# Patient Record
Sex: Female | Born: 1990
Health system: Southern US, Community
[De-identification: ages and names within clinical notes are randomized; demographics above are authoritative.]

## PROBLEM LIST (undated history)

## (undated) DIAGNOSIS — D649 Anemia, unspecified: Secondary | ICD-10-CM

## (undated) DIAGNOSIS — D352 Benign neoplasm of pituitary gland: Secondary | ICD-10-CM

## (undated) DIAGNOSIS — J45909 Unspecified asthma, uncomplicated: Secondary | ICD-10-CM

## (undated) HISTORY — PX: UMBILICAL HERNIA REPAIR: SHX196

---

## 2015-08-10 DIAGNOSIS — D352 Benign neoplasm of pituitary gland: Secondary | ICD-10-CM | POA: Insufficient documentation

## 2015-08-10 DIAGNOSIS — L209 Atopic dermatitis, unspecified: Secondary | ICD-10-CM | POA: Insufficient documentation

## 2016-11-09 DIAGNOSIS — R300 Dysuria: Secondary | ICD-10-CM | POA: Insufficient documentation

## 2016-11-09 DIAGNOSIS — D649 Anemia, unspecified: Secondary | ICD-10-CM | POA: Insufficient documentation

## 2017-04-23 DIAGNOSIS — J45990 Exercise induced bronchospasm: Secondary | ICD-10-CM | POA: Insufficient documentation

## 2020-02-16 ENCOUNTER — Ambulatory Visit (HOSPITAL_COMMUNITY): Admit: 2020-02-16 | Discharge status: Against Medical Advice | Payer: Self-pay

## 2020-05-02 ENCOUNTER — Other Ambulatory Visit: Payer: Self-pay

## 2020-05-02 ENCOUNTER — Ambulatory Visit: Payer: 59 | Attending: Family Medicine | Admitting: Family Medicine

## 2020-05-02 DIAGNOSIS — F419 Anxiety disorder, unspecified: Secondary | ICD-10-CM

## 2020-05-02 DIAGNOSIS — E221 Hyperprolactinemia: Secondary | ICD-10-CM

## 2020-05-02 NOTE — Progress Notes (Signed)
   Virtual Visit via Telephone Note  I connected with Monique Middleton, on 05/02/2020 at 4:02 PM by telephone due to the COVID-19 pandemic and verified that I am speaking with the correct person using two identifiers.   Consent: I discussed the limitations, risks, security and privacy concerns of performing an evaluation and management service by telephone and the availability of in person appointments. I also discussed with the patient that there may be a patient responsible charge related to this service. The patient expressed understanding and agreed to proceed.   Location of Patient: Home  Location of Provider: Clinic   Persons participating in Telemedicine visit: [place] Dr. Margarita Rana     History of Present Illness: 30 year old female h/o Prolactinoma on Carbegoline,  hernia surgery in 2018 Relocated from [place] 10/2019. Prolactinoma has been managed medically. Followed by Endocrine in the past. Last MRI was in 2020 and per patient size was stable. Last time she was on medication was in 02/2020.  This was diagnosed when she had amenorrhea for a period of 6 months and labs revealed hyperprolactinemia and MRI in keeping with prolactinoma. Denies presence of blurry vision (wears glasses), does not have headaches but did have one with her menstrual cycle recently.  She also mentioned noticing palpitations and some form of anxiety once in a while prior to going to bed.  She does not have anxiety on a daily basis and is not open to taking medications for this. No past medical history on file. Not on File  No current outpatient medications on file prior to visit.   No current facility-administered medications on file prior to visit.    ROS: See HPI  Observations/Objective: Awake, alert, and 2x3 Not in acute distress  Assessment and Plan: 1. Hyperprolactinemia ([place]) Advised we would need her medical records She will sign a medical release once she comes into the  office for labs tomorrow - Prolactin; Future - FSH/LH; Future - CMP14+EGFR; Future - CBC with Differential/Platelet; Future - Ambulatory referral to Endocrinology  2.  Anxiety Occurring prior to bedtime We have discussed control measures including breathing into a bag, deep breathing, counting Consider psychotherapy if symptoms persist Symptoms are not bothersome and medication is not indicated at this time agreed upon via shared decision making.  Follow Up Instructions: 3 months for chronic disease management   I discussed the assessment and treatment plan with the patient. The patient was provided an opportunity to ask questions and all were answered. The patient agreed with the plan and demonstrated an understanding of the instructions.   The patient was advised to call back or seek an in-person evaluation if the symptoms worsen or if the condition fails to improve as anticipated.     I provided 12 minutes total of non-face-to-face time during this encounter.   Charlott Rakes, MD, FAAFP. [facility] and [facility] [place], [place]   05/02/2020, 4:02 PM

## 2020-05-03 NOTE — Addendum Note (Signed)
Addended bySteffanie Dunn on: 05/03/2020 08:54 AM   Modules accepted: Orders

## 2020-05-04 ENCOUNTER — Other Ambulatory Visit: Payer: Self-pay | Admitting: Family Medicine

## 2020-05-04 DIAGNOSIS — D649 Anemia, unspecified: Secondary | ICD-10-CM

## 2020-05-04 LAB — CBC WITH DIFFERENTIAL/PLATELET
Basophils Absolute: 0 10*3/uL (ref 0.0–0.2)
Basos: 1 %
EOS (ABSOLUTE): 0.5 10*3/uL — ABNORMAL HIGH (ref 0.0–0.4)
Eos: 8 %
Hematocrit: 30.1 % — ABNORMAL LOW (ref 34.0–46.6)
Hemoglobin: 8.8 g/dL — ABNORMAL LOW (ref 11.1–15.9)
Immature Grans (Abs): 0 10*3/uL (ref 0.0–0.1)
Immature Granulocytes: 0 %
Lymphocytes Absolute: 1.7 10*3/uL (ref 0.7–3.1)
Lymphs: 27 %
MCH: 21.8 pg — ABNORMAL LOW (ref 26.6–33.0)
MCHC: 29.2 g/dL — ABNORMAL LOW (ref 31.5–35.7)
MCV: 75 fL — ABNORMAL LOW (ref 79–97)
Monocytes Absolute: 0.5 10*3/uL (ref 0.1–0.9)
Monocytes: 9 %
Neutrophils Absolute: 3.4 10*3/uL (ref 1.4–7.0)
Neutrophils: 55 %
Platelets: 284 10*3/uL (ref 150–450)
RBC: 4.03 x10E6/uL (ref 3.77–5.28)
RDW: 15.8 % — ABNORMAL HIGH (ref 11.7–15.4)
WBC: 6.2 10*3/uL (ref 3.4–10.8)

## 2020-05-04 LAB — CMP14+EGFR
ALT: 10 IU/L (ref 0–32)
AST: 16 IU/L (ref 0–40)
Albumin/Globulin Ratio: 1.5 (ref 1.2–2.2)
Albumin: 4.2 g/dL (ref 3.9–5.0)
Alkaline Phosphatase: 66 IU/L (ref 44–121)
BUN/Creatinine Ratio: 12 (ref 9–23)
BUN: 10 mg/dL (ref 6–20)
Bilirubin Total: <0.2 mg/dL (ref 0.0–1.2)
CO2: 23 mmol/L (ref 20–29)
Calcium: 9.3 mg/dL (ref 8.7–10.2)
Chloride: 100 mmol/L (ref 96–106)
Creatinine, Ser: 0.85 mg/dL (ref 0.57–1.00)
Globulin, Total: 2.8 g/dL (ref 1.5–4.5)
Glucose: 82 mg/dL (ref 65–99)
Potassium: 4.4 mmol/L (ref 3.5–5.2)
Sodium: 136 mmol/L (ref 134–144)
Total Protein: 7 g/dL (ref 6.0–8.5)
eGFR: 96 mL/min/{1.73_m2} (ref >59)

## 2020-05-04 LAB — FSH/LH
FSH: 6 m[IU]/mL
LH: 8.4 m[IU]/mL

## 2020-05-04 LAB — PROLACTIN: Prolactin: 96.6 ng/mL — ABNORMAL HIGH (ref 4.8–23.3)

## 2020-05-04 MED ORDER — FERROUS SULFATE 324 (65 FE) MG PO TBEC: 324.0000 mg | DELAYED_RELEASE_TABLET | Freq: Two times a day (BID) | ORAL | 3 refills | Status: DC

## 2020-05-05 ENCOUNTER — Telehealth: Payer: Self-pay

## 2020-05-05 NOTE — Telephone Encounter (Signed)
-----   Message from Charlott Rakes, MD sent at 05/04/2020  5:13 PM EDT ----- Please inform her that her prolactin level is elevated and she had been referred to endocrinology for follow-up.  She is also anemic.  Can you please have the lab add on iron studies to her labs?  I ordered iron tablets but we have no pharmacy on file, can you please obtain the pharmacy and send this prescription?  Thank you.

## 2020-05-05 NOTE — Telephone Encounter (Signed)
Phone number is not correct. Pt will be mailed a letter to call our office.

## 2020-05-06 LAB — IRON,TIBC AND FERRITIN PANEL
Ferritin: 7 ng/mL — ABNORMAL LOW (ref 15–150)
Iron Saturation: 4 % — CL (ref 15–55)
Iron: 17 ug/dL — ABNORMAL LOW (ref 27–159)
Total Iron Binding Capacity: 451 ug/dL — ABNORMAL HIGH (ref 250–450)
UIBC: 434 ug/dL — ABNORMAL HIGH (ref 131–425)

## 2020-05-06 LAB — SPECIMEN STATUS REPORT

## 2020-05-10 ENCOUNTER — Other Ambulatory Visit: Payer: Self-pay

## 2020-05-10 MED ORDER — FERROUS SULFATE 324 (65 FE) MG PO TBEC: 324.0000 mg | DELAYED_RELEASE_TABLET | Freq: Two times a day (BID) | ORAL | 3 refills | Status: AC

## 2020-05-10 NOTE — Telephone Encounter (Signed)
Copied from [place] [id number]. Topic: Quick Communication - Lab Results (Clinic Use ONLY) >> May 09, 2020  3:18 PM Lennox Solders wrote: Pt is calling back and would like blood work results. Pt phone number has been updated

## 2020-05-11 NOTE — Telephone Encounter (Signed)
Pt has been informed of lab results and medication has been sent to pharmacy.

## 2020-07-06 ENCOUNTER — Encounter: Payer: Self-pay | Admitting: Endocrinology

## 2020-07-06 ENCOUNTER — Other Ambulatory Visit: Payer: Self-pay

## 2020-07-06 ENCOUNTER — Ambulatory Visit (INDEPENDENT_AMBULATORY_CARE_PROVIDER_SITE_OTHER): Payer: 59 | Admitting: Endocrinology

## 2020-07-06 DIAGNOSIS — E221 Hyperprolactinemia: Secondary | ICD-10-CM | POA: Diagnosis not present

## 2020-07-06 MED ORDER — CABERGOLINE 0.5 MG PO TABS: 0.2500 mg | ORAL_TABLET | ORAL | 3 refills | Status: DC

## 2020-07-06 NOTE — Patient Instructions (Addendum)
I have sent a prescription to your pharmacy, to resume the cabergoline.   This will increase your chances of getting pregnant.  If this happens, you should stop taking it.   Please recheck the blood test in 1 month.   Please come back for a follow-up appointment in 6 months.

## 2020-07-06 NOTE — Progress Notes (Signed)
Subjective:    Patient ID: Monique Middleton, female    DOB: 05-Aug-1990, 30 y.o.   MRN: [id number]  HPI Pt is referred by Dr Margarita Rana, for hyperprolactinemia.  Pt had menarche at age 68.  She has had regular menses, except when on OC's.  until she started OC's x a few mos in 2012.  She is G0.  She reports elev prolactin was found in 2014.  She does not want a pregnancy now, but would like to preserve fertility for the future.    She denies the following: excessive exercise, opiates, antipsychotics, brain XRT, brain surgery, cirrhosis,  thyroid dz, seizures, renal dz, zoster, brain injury, or chest wall injury.  She has never had pituitary imaging.  She took cabergoline 2014-2021.  She says 2014 MRI showed mild abnormality.    Social History   Socioeconomic History  . Marital status: Single    Spouse name: Not on file  . Number of children: Not on file  . Years of education: Not on file  . Highest education level: Not on file  Occupational History  . Not on file  Tobacco Use  . Smoking status: Not on file  . Smokeless tobacco: Not on file  Substance and Sexual Activity  . Alcohol use: Not on file  . Drug use: Not on file  . Sexual activity: Not on file  Other Topics Concern  . Not on file  Social History Narrative  . Not on file   Social Determinants of Health   Financial Resource Strain: Not on file  Food Insecurity: Not on file  Transportation Needs: Not on file  Physical Activity: Not on file  Stress: Not on file  Social Connections: Not on file  Intimate Partner Violence: Not on file    Current Outpatient Medications on File Prior to Visit  Medication Sig Dispense Refill  . ferrous sulfate 324 (65 Fe) MG TBEC Take 1 tablet (324 mg total) by mouth in the morning and at bedtime. 60 tablet 3   No current facility-administered medications on file prior to visit.    No Known Allergies  Family History  Problem Relation Age of Onset  . Other Neg Hx        pituitary  disorder    BP 110/80 (BP Location: Right Arm, Patient Position: Sitting, Cuff Size: Large)   Pulse 88   Ht 5\' 5"  (1.651 m)   Wt 228 lb 3.2 oz (103.5 kg)   SpO2 99%   BMI 37.97 kg/m     Review of Systems denies headache, visual loss, galactorrhea, and n/v.  She has frequent urination and weight gain.     Objective:   Physical Exam VITAL SIGNS:  See vs page.  GENERAL: no distress.  NECK: There is no palpable thyroid enlargement.  No thyroid nodule is palpable.  No palpable lymphadenopathy at the anterior neck. Ext: no leg edema  Ptolactin=97 TSH=2.4  MRI (2020): mild pituitary swelling   I have reviewed outside records, and summarized: Pt was noted to have elevated prolactin, and referred here.  Anxiety and anemia were also addressed     Assessment & Plan:  Hyperprolactinemia, new to me: uncontrolled.   Patient Instructions  I have sent a prescription to your pharmacy, to resume the cabergoline.   This will increase your chances of getting pregnant.  If this happens, you should stop taking it.   Please recheck the blood test in 1 month.   Please come back for a follow-up appointment  in 6 months.

## 2020-07-07 ENCOUNTER — Encounter: Payer: Self-pay | Admitting: Endocrinology

## 2020-07-08 NOTE — Telephone Encounter (Signed)
Please advise 

## 2020-08-25 ENCOUNTER — Other Ambulatory Visit: Payer: Self-pay

## 2020-08-25 ENCOUNTER — Ambulatory Visit
Admission: RE | Admit: 2020-08-25 | Discharge: 2020-08-25 | Disposition: A | Discharge status: Home / Self Care | Payer: 59 | Source: Ambulatory Visit | Attending: Nurse Practitioner | Admitting: Nurse Practitioner

## 2020-08-25 VITALS — BP 139/89 | HR 76 | Temp 98.1°F | Resp 14

## 2020-08-25 DIAGNOSIS — L02219 Cutaneous abscess of trunk, unspecified: Secondary | ICD-10-CM | POA: Insufficient documentation

## 2020-08-25 DIAGNOSIS — N912 Amenorrhea, unspecified: Secondary | ICD-10-CM | POA: Insufficient documentation

## 2020-08-25 DIAGNOSIS — K439 Ventral hernia without obstruction or gangrene: Secondary | ICD-10-CM | POA: Insufficient documentation

## 2020-08-25 HISTORY — DX: Benign neoplasm of pituitary gland: D35.2

## 2020-08-25 MED ORDER — SULFAMETHOXAZOLE-TRIMETHOPRIM 800-160 MG PO TABS: 1.0000 | ORAL_TABLET | Freq: Two times a day (BID) | ORAL | 0 refills | Status: AC

## 2020-08-25 NOTE — ED Triage Notes (Signed)
Suprapubic abcesses, recurrent. Patient states she shaved with a razor and believes it caused in-grown hairs. Denies fever.

## 2020-08-25 NOTE — ED Notes (Signed)
Witnessed exam and I&D procedure. Wound dressed

## 2020-08-25 NOTE — Discharge Instructions (Addendum)
Take medications as prescribed  Moist, warm compress to site at least three times a day  Do not mash or manipulate the wound  Ok to change top dressing  Do not remove packing  Return in 2 days to have packing removed and wound re-checked

## 2020-08-25 NOTE — ED Provider Notes (Signed)
[facility]    CSN: [id number] Arrival date & time: 08/25/20  1049      History   Chief Complaint Chief Complaint  Patient presents with   Abscess    HPI Monique Middleton is a 30 y.o. female.   Subjective:   Monique Middleton is a 30 y.o. female who presents for evaluation of a probable cutaneous abscess. Lesion is located in the right sided pubic region. Onset was 3 weeks ago. Symptoms started several days after shaving pubic area. Symptoms have waxed and waned but are worse overall. She has had smaller abscesses to the same area as well that has spontaneously drained. The larger one that she is seeking evaluation for today has not drained and is getting larger in size.   Abscess has associated symptoms of spontaneous drainage and pain. Patient does not have previous history of cutaneous abscesses. Patient does not have diabetes.  The following portions of the patient's history were reviewed and updated as appropriate: allergies, current medications, past family history, past medical history, past social history, past surgical history, and problem list.     Past Medical History:  Diagnosis Date   Pituitary adenoma [facility])     Patient Active Problem List   Diagnosis Date Noted   Amenorrhea 08/25/2020   Hernia of anterior abdominal wall 08/25/2020   Hyperprolactinemia ([place]) 07/06/2020   Exercise-induced asthma 04/23/2017   Anemia 11/09/2016   Difficult or painful urination 11/09/2016   Atopic dermatitis 08/10/2015   Prolactinoma ([place]) 08/10/2015    History reviewed. No pertinent surgical history.  OB History   No obstetric history on file.      Home Medications    Prior to Admission medications   Medication Sig Start Date End Date Taking? Authorizing Provider  cabergoline (DOSTINEX) 0.5 MG tablet Take 0.5 tablets (0.25 mg total) by mouth 2 (two) times a week. 07/07/20  Yes Renato Shin, MD  cabergoline (DOSTINEX) 0.5 MG tablet TAKE 2 TABLETS BY  MOUTH TWICE WEEKLY FOR A TOTAL OF 4 TABS PER WEEK 10/08/19  Yes [provider]  ferrous sulfate 324 (65 Fe) MG TBEC Take 1 tablet (324 mg total) by mouth in the morning and at bedtime. 05/10/20  Yes Charlott Rakes, MD  sulfamethoxazole-trimethoprim (BACTRIM DS) 800-160 MG tablet Take 1 tablet by mouth 2 (two) times daily for 7 days. 08/25/20 09/01/20 Yes Enrique Sack, FNP    Family History Family History  Problem Relation Age of Onset   Other Neg Hx        pituitary disorder    Social History Social History   Tobacco Use   Smoking status: Unknown     Allergies   Amoxicillin   Review of Systems Review of Systems  Constitutional:  Negative for fever.  Skin:  Positive for wound.  All other systems reviewed and are negative.   Physical Exam Triage Vital Signs ED Triage Vitals  Enc Vitals Group     BP 08/25/20 1211 139/89     Pulse Rate 08/25/20 1211 76     Resp 08/25/20 1211 14     Temp 08/25/20 1211 98.1 F (36.7 C)     Temp Source 08/25/20 1211 Oral     SpO2 08/25/20 1211 99 %     Weight --      Height --      Head Circumference --      Peak Flow --      Pain Score 08/25/20 1212 7     Pain  Loc --      Pain Edu? --      Excl. in [place]? --    No data found.  Updated Vital Signs BP 139/89 (BP Location: Left Arm)   Pulse 76   Temp 98.1 F (36.7 C) (Oral)   Resp 14   LMP 08/24/2020   SpO2 99%   Visual Acuity Right Eye Distance:   Left Eye Distance:   Bilateral Distance:    Right Eye Near:   Left Eye Near:    Bilateral Near:     Physical Exam Vitals reviewed. Exam conducted with a chaperone present.  Constitutional:      Appearance: Normal appearance.  HENT:     Head: Normocephalic.  Cardiovascular:     Rate and Rhythm: Normal rate.  Pulmonary:     Effort: Pulmonary effort is normal.  Genitourinary:    Comments: Pubic abscess noted Musculoskeletal:        General: Normal range of motion.     Cervical back: Normal range of motion  and neck supple.  Skin:    General: Skin is warm and dry.  Neurological:     General: No focal deficit present.     Mental Status: She is alert and oriented to person, place, and time.  Psychiatric:        Mood and Affect: Mood normal.        Behavior: Behavior normal.     UC Treatments / Results  Labs (all labs ordered are listed, but only abnormal results are displayed) Labs Reviewed  AEROBIC CULTURE W GRAM STAIN (SUPERFICIAL SPECIMEN)    EKG   Radiology No results found.  Procedures Incision and Drainage  Date/Time: 08/25/2020 1:31 PM Performed by: Enrique Sack, FNP Authorized by: Enrique Sack, FNP   Consent:    Consent obtained:  Verbal   Consent given by:  Patient   Risks, benefits, and alternatives were discussed: yes     Risks discussed:  Bleeding, incomplete drainage, pain and infection Universal protocol:    Patient identity confirmed:  Verbally with patient Location:    Type:  Abscess   Location:  Anogenital   Anogenital location: pubic. Pre-procedure details:    Skin preparation:  Povidone-iodine Sedation:    Sedation type:  None Anesthesia:    Anesthesia method:  Local infiltration   Local anesthetic:  Lidocaine 1% w/o epi Procedure type:    Complexity:  Simple Procedure details:    Incision types:  Single straight   Wound management:  Probed and deloculated   Drainage:  Purulent   Drainage amount:  Scant   Wound treatment:  Drain placed   Packing materials:  1/4 in iodoform gauze Post-procedure details:    Procedure completion:  Tolerated well, no immediate complications (including critical care time)  Medications Ordered in UC Medications - No data to display  Initial Impression / Assessment and Plan / UC Course  I have reviewed the triage vital signs and the nursing notes.  Pertinent labs & imaging results that were available during my care of the patient were reviewed by me and considered in my medical decision making (see  chart for details).     30 yo female with abscess to the pubic region.  Patient is afebrile.  Nontoxic.   Plan:  Apply hot compresses frequently to promote drainage. Oral antibiotics -- see med orders. I & D procedure as above. RTC in 2 days or PRN.  Today's evaluation has revealed no signs of a dangerous process.  Discussed diagnosis with patient and/or guardian. Patient and/or guardian aware of their diagnosis, possible red flag symptoms to watch out for and need for close follow up. Patient and/or guardian understands verbal and written discharge instructions. Patient and/or guardian comfortable with plan and disposition.  Patient and/or guardian has a clear mental status at this time, good insight into illness (after discussion and teaching) and has clear judgment to make decisions regarding their care  This care was provided during an unprecedented National Emergency due to the Novel Coronavirus (COVID-19) pandemic. COVID-19 infections and transmission risks place heavy strains on healthcare resources.  As this pandemic evolves, our facility, providers, and staff strive to respond fluidly, to remain operational, and to provide care relative to available resources and information. Outcomes are unpredictable and treatments are without well-defined guidelines. Further, the impact of COVID-19 on all aspects of urgent care, including the impact to patients seeking care for reasons other than COVID-19, is unavoidable during this national emergency. At this time of the global pandemic, management of patients has significantly changed, even for non-COVID positive patients given high local and regional COVID volumes at this time requiring high healthcare system and resource utilization. The standard of care for management of both COVID suspected and non-COVID suspected patients continues to change rapidly at the local, regional, national, and global levels. This patient was worked up and treated to the  best available but ever changing evidence and resources available at this current time.   Documentation was completed with the aid of voice recognition software. Transcription may contain typographical errors.   Final Clinical Impressions(s) / UC Diagnoses   Final diagnoses:  Abscess of pubic region     Discharge Instructions      Take medications as prescribed  Moist, warm compress to site at least three times a day  Do not mash or manipulate the wound  Ok to change top dressing  Do not remove packing  Return in 2 days to have packing removed and wound re-checked      ED Prescriptions     Medication Sig Dispense Auth. Provider   sulfamethoxazole-trimethoprim (BACTRIM DS) 800-160 MG tablet Take 1 tablet by mouth 2 (two) times daily for 7 days. 14 tablet Enrique Sack, FNP      PDMP not reviewed this encounter.   Enrique Sack,  08/25/20 1445

## 2020-08-27 ENCOUNTER — Encounter: Payer: Self-pay | Admitting: Emergency Medicine

## 2020-08-27 ENCOUNTER — Other Ambulatory Visit: Payer: Self-pay

## 2020-08-27 ENCOUNTER — Ambulatory Visit
Admission: EM | Admit: 2020-08-27 | Discharge: 2020-08-27 | Disposition: A | Discharge status: Home / Self Care | Payer: 59

## 2020-08-27 DIAGNOSIS — Z5189 Encounter for other specified aftercare: Secondary | ICD-10-CM

## 2020-08-27 DIAGNOSIS — L0291 Cutaneous abscess, unspecified: Secondary | ICD-10-CM

## 2020-08-27 NOTE — ED Triage Notes (Signed)
Patient c/o abscess x 2 weeks.   Patient seen 2 days ago at this clinic and had the site drained per patient statement.   Patient states "the packing feel out last night".   Patient endorses foul odor to site.   Patient endorses serosanguinous drainage.   Patient was placed on Bactrim and started regimen yesterday.

## 2020-08-27 NOTE — Discharge Instructions (Addendum)
Continue changing dressings at least twice daily.  Complete entire course of antibiotics.  Return as needed.

## 2020-08-28 LAB — AEROBIC CULTURE W GRAM STAIN (SUPERFICIAL SPECIMEN): Special Requests: NORMAL

## 2020-10-27 ENCOUNTER — Other Ambulatory Visit: Payer: Self-pay

## 2020-10-27 ENCOUNTER — Other Ambulatory Visit (INDEPENDENT_AMBULATORY_CARE_PROVIDER_SITE_OTHER): Payer: 59

## 2020-10-27 DIAGNOSIS — E221 Hyperprolactinemia: Secondary | ICD-10-CM | POA: Diagnosis not present

## 2020-10-27 LAB — TSH: TSH: 2.5 u[IU]/mL (ref 0.35–5.50)

## 2020-10-27 LAB — T4, FREE: Free T4: 0.8 ng/dL (ref 0.60–1.60)

## 2020-10-27 LAB — PROLACTIN: Prolactin: 62.5 ng/mL — ABNORMAL HIGH

## 2020-10-28 ENCOUNTER — Other Ambulatory Visit: Payer: Self-pay | Admitting: Endocrinology

## 2020-10-28 MED ORDER — CABERGOLINE 0.5 MG PO TABS: 0.5000 mg | ORAL_TABLET | ORAL | 3 refills | Status: AC

## 2020-11-15 ENCOUNTER — Encounter (HOSPITAL_BASED_OUTPATIENT_CLINIC_OR_DEPARTMENT_OTHER): Payer: Self-pay | Admitting: Emergency Medicine

## 2020-11-15 ENCOUNTER — Ambulatory Visit
Admission: EM | Admit: 2020-11-15 | Discharge: 2020-11-15 | Disposition: A | Discharge status: Home / Self Care | Payer: 59 | Attending: Internal Medicine | Admitting: Internal Medicine

## 2020-11-15 ENCOUNTER — Emergency Department (HOSPITAL_BASED_OUTPATIENT_CLINIC_OR_DEPARTMENT_OTHER)
Admission: EM | Admit: 2020-11-15 | Discharge: 2020-11-15 | Disposition: A | Discharge status: Home / Self Care | Payer: 59 | Attending: Emergency Medicine | Admitting: Emergency Medicine

## 2020-11-15 ENCOUNTER — Encounter: Payer: Self-pay | Admitting: Emergency Medicine

## 2020-11-15 ENCOUNTER — Emergency Department (HOSPITAL_BASED_OUTPATIENT_CLINIC_OR_DEPARTMENT_OTHER): Payer: 59

## 2020-11-15 ENCOUNTER — Other Ambulatory Visit: Payer: Self-pay

## 2020-11-15 DIAGNOSIS — N739 Female pelvic inflammatory disease, unspecified: Secondary | ICD-10-CM | POA: Diagnosis not present

## 2020-11-15 DIAGNOSIS — N730 Acute parametritis and pelvic cellulitis: Secondary | ICD-10-CM

## 2020-11-15 DIAGNOSIS — R52 Pain, unspecified: Secondary | ICD-10-CM

## 2020-11-15 DIAGNOSIS — R110 Nausea: Secondary | ICD-10-CM | POA: Diagnosis not present

## 2020-11-15 DIAGNOSIS — K5900 Constipation, unspecified: Secondary | ICD-10-CM

## 2020-11-15 DIAGNOSIS — R1031 Right lower quadrant pain: Secondary | ICD-10-CM

## 2020-11-15 DIAGNOSIS — R109 Unspecified abdominal pain: Secondary | ICD-10-CM | POA: Diagnosis not present

## 2020-11-15 DIAGNOSIS — D259 Leiomyoma of uterus, unspecified: Secondary | ICD-10-CM

## 2020-11-15 LAB — COMPREHENSIVE METABOLIC PANEL
ALT: 7 U/L (ref 0–44)
AST: 13 U/L — ABNORMAL LOW (ref 15–41)
Albumin: 4.3 g/dL (ref 3.5–5.0)
Alkaline Phosphatase: 62 U/L (ref 38–126)
Anion gap: 12 (ref 5–15)
BUN: 8 mg/dL (ref 6–20)
CO2: 23 mmol/L (ref 22–32)
Calcium: 10 mg/dL (ref 8.9–10.3)
Chloride: 102 mmol/L (ref 98–111)
Creatinine, Ser: 0.7 mg/dL (ref 0.44–1.00)
GFR, Estimated: >60 mL/min (ref >60)
Glucose, Bld: 89 mg/dL (ref 70–99)
Potassium: 3.9 mmol/L (ref 3.5–5.1)
Sodium: 137 mmol/L (ref 135–145)
Total Bilirubin: 0.3 mg/dL (ref 0.3–1.2)
Total Protein: 7.9 g/dL (ref 6.5–8.1)

## 2020-11-15 LAB — URINALYSIS, ROUTINE W REFLEX MICROSCOPIC
Bilirubin Urine: NEGATIVE
Glucose, UA: NEGATIVE mg/dL
Hgb urine dipstick: NEGATIVE
Ketones, ur: NEGATIVE mg/dL
Leukocytes,Ua: NEGATIVE
Nitrite: NEGATIVE
Protein, ur: NEGATIVE mg/dL
Specific Gravity, Urine: <1.005 — ABNORMAL LOW (ref 1.005–1.030)
pH: 6.5 (ref 5.0–8.0)

## 2020-11-15 LAB — HIV ANTIBODY (ROUTINE TESTING W REFLEX): HIV Screen 4th Generation wRfx: NONREACTIVE

## 2020-11-15 LAB — CBC WITH DIFFERENTIAL/PLATELET
Abs Immature Granulocytes: 0.01 10*3/uL (ref 0.00–0.07)
Basophils Absolute: 0 10*3/uL (ref 0.0–0.1)
Basophils Relative: 0 %
Eosinophils Absolute: 0.1 10*3/uL (ref 0.0–0.5)
Eosinophils Relative: 2 %
HCT: 33.2 % — ABNORMAL LOW (ref 36.0–46.0)
Hemoglobin: 10.4 g/dL — ABNORMAL LOW (ref 12.0–15.0)
Immature Granulocytes: 0 %
Lymphocytes Relative: 18 %
Lymphs Abs: 1.4 10*3/uL (ref 0.7–4.0)
MCH: 23 pg — ABNORMAL LOW (ref 26.0–34.0)
MCHC: 31.3 g/dL (ref 30.0–36.0)
MCV: 73.3 fL — ABNORMAL LOW (ref 80.0–100.0)
Monocytes Absolute: 0.5 10*3/uL (ref 0.1–1.0)
Monocytes Relative: 7 %
Neutro Abs: 5.5 10*3/uL (ref 1.7–7.7)
Neutrophils Relative %: 73 %
Platelets: 277 10*3/uL (ref 150–400)
RBC: 4.53 MIL/uL (ref 3.87–5.11)
RDW: 15.9 % — ABNORMAL HIGH (ref 11.5–15.5)
WBC: 7.5 10*3/uL (ref 4.0–10.5)
nRBC: 0 % (ref 0.0–0.2)

## 2020-11-15 LAB — POCT URINALYSIS DIP (MANUAL ENTRY)
Bilirubin, UA: NEGATIVE
Blood, UA: NEGATIVE
Glucose, UA: NEGATIVE mg/dL
Ketones, POC UA: NEGATIVE mg/dL
Leukocytes, UA: NEGATIVE
Nitrite, UA: NEGATIVE
Protein Ur, POC: NEGATIVE mg/dL
Spec Grav, UA: 1.02 (ref 1.010–1.025)
Urobilinogen, UA: 0.2 E.U./dL
pH, UA: 6 (ref 5.0–8.0)

## 2020-11-15 LAB — PREGNANCY, URINE: Preg Test, Ur: NEGATIVE

## 2020-11-15 LAB — WET PREP, GENITAL
Clue Cells Wet Prep HPF POC: NONE SEEN
Sperm: NONE SEEN
Trich, Wet Prep: NONE SEEN
Yeast Wet Prep HPF POC: NONE SEEN

## 2020-11-15 LAB — LIPASE, BLOOD: Lipase: 11 U/L (ref 11–51)

## 2020-11-15 LAB — POCT URINE PREGNANCY: Preg Test, Ur: NEGATIVE

## 2020-11-15 MED ORDER — LIDOCAINE HCL (PF) 1 % IJ SOLN
INTRAMUSCULAR | Status: AC
  Filled 2020-11-15: qty 5

## 2020-11-15 MED ORDER — DOXYCYCLINE HYCLATE 100 MG PO CAPS: 100.0000 mg | ORAL_CAPSULE | Freq: Two times a day (BID) | ORAL | 0 refills | Status: AC

## 2020-11-15 MED ORDER — CEFTRIAXONE SODIUM 500 MG IJ SOLR
500.0000 mg | Freq: Once | INTRAMUSCULAR | Status: AC
  Administered 2020-11-15: 500 mg via INTRAMUSCULAR
  Filled 2020-11-15: qty 500

## 2020-11-15 MED ORDER — IOHEXOL 300 MG/ML  SOLN
100.0000 mL | Freq: Once | INTRAMUSCULAR | Status: AC | PRN
  Administered 2020-11-15: 100 mL via INTRAVENOUS

## 2020-11-15 NOTE — ED Provider Notes (Signed)
[facility]    CSN: [id number] Arrival date & time: 11/15/20  7412      History   Chief Complaint Chief Complaint  Patient presents with   Abdominal Pain    HPI Monique Middleton is a 30 y.o. female.   Patient presents with lower abdominal pain and pressure that started approximately 2 to 3 days ago.  Patient reports that she felt a "shock" in pelvic area that quickly resolved.  Shortly after, she developed lower abdominal pain and pressure that has been constant and has become more severe over the past few days.  Patient denies any urinary burning, urinary frequency, urinary urgency, vaginal discharge, hematuria, back pain, fever.  Last bowel movement was approximately 2 to 3 days ago when symptoms started.  Denies noticing any blood in stools.  Denies vomiting but has had some associated nausea.  Last menstrual cycle was 1 week prior.  Patient is not currently on birth control.  Denies any recent unprotected sexual intercourse.  Pain is exacerbated by sitting.  Pain is described as "throbbing" and is constant.  Pain is rated 8-10/10 on a pain scale.  Denies any chest pain or shortness of breath.   Abdominal Pain  Past Medical History:  Diagnosis Date   Pituitary adenoma [facility])     Patient Active Problem List   Diagnosis Date Noted   Amenorrhea 08/25/2020   Hernia of anterior abdominal wall 08/25/2020   Hyperprolactinemia ([place]) 07/06/2020   Exercise-induced asthma 04/23/2017   Anemia 11/09/2016   Difficult or painful urination 11/09/2016   Atopic dermatitis 08/10/2015   Prolactinoma ([place]) 08/10/2015    History reviewed. No pertinent surgical history.  OB History   No obstetric history on file.      Home Medications    Prior to Admission medications   Medication Sig Start Date End Date Taking? Authorizing Provider  cabergoline (DOSTINEX) 0.5 MG tablet Take 1 tablet (0.5 mg total) by mouth 2 (two) times a week. 10/31/20   Renato Shin, MD  ferrous  sulfate 324 (65 Fe) MG TBEC Take 1 tablet (324 mg total) by mouth in the morning and at bedtime. 05/10/20   Charlott Rakes, MD    Family History Family History  Problem Relation Age of Onset   Other Neg Hx        pituitary disorder    Social History Social History   Tobacco Use   Smoking status: Never   Smokeless tobacco: Never     Allergies   Amoxicillin   Review of Systems Review of Systems Per HPI  Physical Exam Triage Vital Signs ED Triage Vitals  Enc Vitals Group     BP 11/15/20 0934 (!) 168/92     Pulse Rate 11/15/20 0934 (!) 107     Resp 11/15/20 0934 16     Temp 11/15/20 0934 98.2 F (36.8 C)     Temp Source 11/15/20 0934 Oral     SpO2 11/15/20 0934 98 %     Weight --      Height --      Head Circumference --      Peak Flow --      Pain Score 11/15/20 0937 7     Pain Loc --      Pain Edu? --      Excl. in [place]? --    No data found.  Updated Vital Signs BP (!) 168/92 (BP Location: Left Arm)   Pulse (!) 107   Temp 98.2 F (36.8  C) (Oral)   Resp 16   SpO2 98%   Visual Acuity Right Eye Distance:   Left Eye Distance:   Bilateral Distance:    Right Eye Near:   Left Eye Near:    Bilateral Near:     Physical Exam Constitutional:      General: She is not in acute distress.    Appearance: Normal appearance. She is not toxic-appearing or diaphoretic.  HENT:     Head: Normocephalic and atraumatic.     Mouth/Throat:     Mouth: Mucous membranes are moist.     Pharynx: No posterior oropharyngeal erythema.  Eyes:     Extraocular Movements: Extraocular movements intact.     Conjunctiva/sclera: Conjunctivae normal.  Cardiovascular:     Rate and Rhythm: Regular rhythm. Tachycardia present.     Pulses: Normal pulses.     Heart sounds: Normal heart sounds.  Pulmonary:     Effort: Pulmonary effort is normal. No respiratory distress.  Abdominal:     General: Bowel sounds are normal. There is no distension.     Palpations: Abdomen is soft.      Tenderness: There is abdominal tenderness in the right lower quadrant and suprapubic area.     Hernia: No hernia is present.  Skin:    General: Skin is warm and dry.  Neurological:     General: No focal deficit present.     Mental Status: She is alert and oriented to person, place, and time. Mental status is at baseline.  Psychiatric:        Mood and Affect: Mood normal.        Behavior: Behavior normal.        Thought Content: Thought content normal.        Judgment: Judgment normal.     UC Treatments / Results  Labs (all labs ordered are listed, but only abnormal results are displayed) Labs Reviewed  POCT URINALYSIS DIP (MANUAL ENTRY)  POCT URINE PREGNANCY    EKG   Radiology No results found.  Procedures Procedures (including critical care time)  Medications Ordered in UC Medications - No data to display  Initial Impression / Assessment and Plan / UC Course  I have reviewed the triage vital signs and the nursing notes.  Pertinent labs & imaging results that were available during my care of the patient were reviewed by me and considered in my medical decision making (see chart for details).     Urinalysis did not show any signs of urinary tract infection.  Urine pregnancy was negative.  Differential diagnoses include constipation, possible ruptured cyst on the ovary, appendicitis.  Higher suspicion leaning towards constipation given patient has not had a bowel movement in multiple days and having abdominal pain.  Also, patient has increased blood pressure, increased heart rate, and is having severe abdominal pain that is worsening over the past few days.  Advised patient that she will need to go to the hospital for further evaluation and management.  Patient was agreeable with plan.  Vital signs stable at discharge.  Agree with patient self transport to the hospital. Final Clinical Impressions(s) / UC Diagnoses   Final diagnoses:  Continuous severe abdominal pain      Discharge Instructions      Please go to the hospital as soon as you leave urgent care for further evaluation and management.     ED Prescriptions   None    PDMP not reviewed this encounter.   Odis Luster, FNP  11/15/20 1027  

## 2020-11-15 NOTE — ED Notes (Signed)
Pt discharged home after verbalizing understanding of discharge instructions; nad noted. 

## 2020-11-15 NOTE — Discharge Instructions (Addendum)
Please pick up medication and take as prescribed. Await your gonorrhea and chlamydia test results - if positive you will need to let all partners know to be tested and treated.   Refrain from intercourse until you finish your antibiotics  I would recommend Miralax daily to help with constipation.   Please also follow up with your OBGYN regarding ED visit and for further evaluation of your uterine fibroid  Return to the ED IMMEDIATELY for any new/worsening symptoms including fevers, worsening pain, vomiting.

## 2020-11-15 NOTE — ED Triage Notes (Addendum)
Sunday felt a "shock" of pain in pelvic area, ever since then had pain throughout lower abdomin/pelvic area. Not on period, states she should be ovulating at this point in her cycle. Has tried a heating pad with minor improvement. Has not had a BM since Sunday. Denies changes to urinary pattern. Sitting increases pain. Denies any chance of pregnancy

## 2020-11-15 NOTE — ED Triage Notes (Signed)
Pt arrives to ED with c/o of lower abdominal pain that started x3 days ago. She reports she felt a "shock" of pain in her vaginal/rectal area and since then she's had constant lower dull abdominal pain. Her LMP was on 9/21. No dysuria, hematuria, or polyuria. Last BM was x3 days ago, when this pain occurred. No BC currently. Pt is currently sexually active with one man, unprotected. Lay down makes the pain worse, while moving around eases the pain. Pain 7/10.

## 2020-11-15 NOTE — Discharge Instructions (Addendum)
Please go to the hospital as soon as you leave urgent care for further evaluation and management. 

## 2020-11-15 NOTE — ED Provider Notes (Signed)
[facility] EMERGENCY DEPT Provider Note   CSN: [id number] Arrival date & time: 11/15/20  1026     History Chief Complaint  Patient presents with   Abdominal Pain    Monique Middleton is a 30 y.o. female who presents to the ED today with complaint of gradual onset, constant, throbbing/pressure like sensation in her lower abdomen x 3 days. Pt reports that 4 days ago she had a sharp sudden "shock" in her pelvic area. She did not think much of it and it relieved after she had a BM a couple of hours later however the next day she woke up with generalized lower abdominal pain. She also complains of nausea; no vomiting. She attempted to apply warm compresses without relief. She has not taken anything specifically for her pain. She has not had a BM since that time which is atypical for her. She went to UC today and had a negative UPT and U/A done and was advised to come to the ED for further eval. PSHx includes umbilical hernia repair. Denies fevers, chills, vomiting, diarrhea, vaginal discharge, vaginal bleeding, or any other associated symptoms. LNMP 09/15  The history is provided by the patient and medical records.  Abdominal Pain Pain location:  RLQ and suprapubic Pain quality: pressure and throbbing   Pain radiates to:  Does not radiate Pain severity:  Moderate Onset quality:  Gradual Duration:  3 days Timing:  Constant Progression:  Worsening Chronicity:  New Relieved by:  Nothing Exacerbated by: laying flat. Ineffective treatments: warm compresses. Associated symptoms: constipation and nausea   Associated symptoms: no chills, no diarrhea, no dysuria, no fever, no vaginal bleeding, no vaginal discharge and no vomiting       Past Medical History:  Diagnosis Date   Pituitary adenoma [facility])     Patient Active Problem List   Diagnosis Date Noted   Amenorrhea 08/25/2020   Hernia of anterior abdominal wall 08/25/2020   Hyperprolactinemia ([place]) 07/06/2020    Exercise-induced asthma 04/23/2017   Anemia 11/09/2016   Difficult or painful urination 11/09/2016   Atopic dermatitis 08/10/2015   Prolactinoma ([place]) 08/10/2015    Past Surgical History:  Procedure Laterality Date   UMBILICAL HERNIA REPAIR       OB History   No obstetric history on file.     Family History  Problem Relation Age of Onset   Other Neg Hx        pituitary disorder    Social History   Tobacco Use   Smoking status: Never   Smokeless tobacco: Never  Substance Use Topics   Alcohol use: Yes   Drug use: Yes    Types: Marijuana    Home Medications Prior to Admission medications   Medication Sig Start Date End Date Taking? Authorizing Provider  cabergoline (DOSTINEX) 0.5 MG tablet Take 1 tablet (0.5 mg total) by mouth 2 (two) times a week. 10/31/20  Yes Renato Shin, MD  doxycycline (VIBRAMYCIN) 100 MG capsule Take 1 capsule (100 mg total) by mouth 2 (two) times daily for 14 days. 11/15/20 11/29/20 Yes Vue Pavon, PA-C  ferrous sulfate 324 (65 Fe) MG TBEC Take 1 tablet (324 mg total) by mouth in the morning and at bedtime. 05/10/20  Yes Charlott Rakes, MD    Allergies    Amoxicillin  Review of Systems   Review of Systems  Constitutional:  Negative for chills and fever.  Gastrointestinal:  Positive for abdominal pain, constipation and nausea. Negative for diarrhea and vomiting.  Genitourinary:  Negative  for dysuria, flank pain, frequency, vaginal bleeding and vaginal discharge.  All other systems reviewed and are negative.  Physical Exam Updated Vital Signs BP 128/74   Pulse 92   Temp 98.5 F (36.9 C)   Resp 16   Ht 5\' 5"  (1.651 m)   Wt 99.8 kg   SpO2 100%   BMI 36.61 kg/m   Physical Exam Vitals and nursing note reviewed.  Constitutional:      Appearance: She is obese. She is not ill-appearing or diaphoretic.  HENT:     Head: Normocephalic and atraumatic.  Eyes:     Conjunctiva/sclera: Conjunctivae normal.  Cardiovascular:     Rate  and Rhythm: Normal rate and regular rhythm.  Pulmonary:     Effort: Pulmonary effort is normal.     Breath sounds: Normal breath sounds. No wheezing, rhonchi or rales.  Abdominal:     General: Bowel sounds are normal.     Palpations: Abdomen is soft.     Tenderness: There is abdominal tenderness in the right lower quadrant and suprapubic area. There is no guarding or rebound.  Genitourinary:    Comments: Chaperone present for exam. No rashes, lesions, or tenderness to external genitalia. No erythema, injury, or tenderness to vaginal mucosa. Thin off white vaginal discharge in vault. No adnexal masses, tenderness, or fullness. + CMT. Cervical os is closed. Uterus non-deviated, mobile, nonTTP, and without enlargement.  Musculoskeletal:     Cervical back: Neck supple.  Skin:    General: Skin is warm and dry.  Neurological:     Mental Status: She is alert.    ED Results / Procedures / Treatments   Labs (all labs ordered are listed, but only abnormal results are displayed) Labs Reviewed  WET PREP, GENITAL - Abnormal; Notable for the following components:      Result Value   WBC, Wet Prep HPF POC FEW (*)    All other components within normal limits  COMPREHENSIVE METABOLIC PANEL - Abnormal; Notable for the following components:   AST 13 (*)    All other components within normal limits  CBC WITH DIFFERENTIAL/PLATELET - Abnormal; Notable for the following components:   Hemoglobin 10.4 (*)    HCT 33.2 (*)    MCV 73.3 (*)    MCH 23.0 (*)    RDW 15.9 (*)    All other components within normal limits  URINALYSIS, ROUTINE W REFLEX MICROSCOPIC - Abnormal; Notable for the following components:   Color, Urine COLORLESS (*)    Specific Gravity, Urine <1.005 (*)    All other components within normal limits  LIPASE, BLOOD  PREGNANCY, URINE  RPR  HIV ANTIBODY (ROUTINE TESTING W REFLEX)  GC/CHLAMYDIA PROBE AMP ([place]) NOT AT [facility]    EKG None  Radiology CT Abdomen Pelvis W  Contrast  Result Date: 11/15/2020 CLINICAL DATA:  Acute abdominal pain. EXAM: CT ABDOMEN AND PELVIS WITH CONTRAST TECHNIQUE: Multidetector CT imaging of the abdomen and pelvis was performed using the standard protocol following bolus administration of intravenous contrast. CONTRAST:  148mL OMNIPAQUE IOHEXOL 300 MG/ML  SOLN COMPARISON:  Ultrasound pelvis 11/15/2020 FINDINGS: Lower chest: No acute abnormality. Hepatobiliary: No focal liver abnormality is seen. No gallstones, gallbladder wall thickening, or biliary dilatation. Pancreas: Unremarkable. No pancreatic ductal dilatation or surrounding inflammatory changes. Spleen: Normal in size without focal abnormality. Adrenals/Urinary Tract: Adrenal glands are unremarkable. Kidneys are normal, without renal calculi, focal lesion, or hydronephrosis. Bladder is unremarkable. Stomach/Bowel: Stomach is within normal limits. Appendix is not seen.  No evidence of bowel wall thickening, distention, or inflammatory changes. Vascular/Lymphatic: No significant vascular findings are present. No enlarged abdominal or pelvic lymph nodes. Reproductive: 8.6 x 7.5 cm hypodense area in the uterus compatible with fibroid seen on recent ultrasound. Adnexa are unremarkable. Other: Trace free fluid in the pelvis. No focal abdominal wall hernia. Surgical clips are seen in the right upper quadrant adjacent to the left lobe of the liver. Musculoskeletal: No acute or significant osseous findings. IMPRESSION: 1. Trace free fluid in the pelvis may be physiologic. 2. 8.6 cm uterine fibroid. 3. No other acute localizing process identified. Note is made that the appendix is not seen. If there is high clinical concern for acute appendicitis, consider repeat evaluation with oral contrast. Electronically Signed   By: Ronney Asters M.D.   On: 11/15/2020 16:44   US PELVIC COMPLETE W TRANSVAGINAL AND TORSION R/O  Result Date: 11/15/2020 CLINICAL DATA:  Lower abdominal pain and pressure for 2-3 days  EXAM: TRANSABDOMINAL AND TRANSVAGINAL ULTRASOUND OF PELVIS DOPPLER ULTRASOUND OF OVARIES TECHNIQUE: Both transabdominal and transvaginal ultrasound examinations of the pelvis were performed. Transabdominal technique was performed for global imaging of the pelvis including uterus, ovaries, adnexal regions, and pelvic cul-de-sac. It was necessary to proceed with endovaginal exam following the transabdominal exam to visualize the ovaries. Color and duplex Doppler ultrasound was utilized to evaluate blood flow to the ovaries. COMPARISON:  None. FINDINGS: Uterus Measurements: 10.9 x 8.3 x 9.0 cm = volume: 425 mL. Anteverted. Large fibroid measuring 7.7 x 6.9 x 7.5 cm, which may be partially submucosal. Small nabothian cysts at the level of the cervix. Endometrium Thickness: 18 mm.  No focal abnormality visualized. Right ovary Measurements: 2.7 x 2.6 x 2.2 cm = volume: 8 mL. Normal appearance/no adnexal mass. Left ovary Measurements: 4.4 x 1.4 x 3.2 cm = volume: 10 mL. Normal appearance/no adnexal mass. Pulsed Doppler evaluation of both ovaries demonstrates normal low-resistance arterial and venous waveforms. Other findings No abnormal free fluid. IMPRESSION: 1. No evidence of adnexal torsion. 2. Large uterine fibroid measuring up to 7.7 cm, which may be partially submucosal. Electronically Signed   By: Davina Poke D.O.   On: 11/15/2020 14:20    Procedures Procedures   Medications Ordered in ED Medications  cefTRIAXone (ROCEPHIN) injection 500 mg (has no administration in time range)  iohexol (OMNIPAQUE) 300 MG/ML solution 100 mL (100 mLs Intravenous Contrast Given 11/15/20 1619)    ED Course  I have reviewed the triage vital signs and the nursing notes.  Pertinent labs & imaging results that were available during my care of the patient were reviewed by me and considered in my medical decision making (see chart for details).    MDM Rules/Calculators/A&P                           30 year old female  who presents to the ED today with complaint of generalized lower abdominal pain for the past 3 days with associated nausea and constipation.  Symptoms started after she felt a shock sensation in her pelvic area the day before.  She went to urgent care today and was advised to come to the ED for further evaluation after negative urine pregnancy and urinalysis.  On arrival to the ED today patient is slightly tachycardic in the 110s, nontachypneic, afebrile.  On my exam she has lower abdominal tenderness palpation most specifically to the right lower quadrant and suprapubic area without rebound or guarding.  She  denies any vaginal discharge however is sexually active with 1 female partner.  She denies risk of STIs.  We will plan for lab work at this time as well as pelvic exam for further evaluation to assess if this is adnexal pain versus true abdominal pain.  Depending on lab work and pelvic exam may consider pelvic ultrasound versus CT abdomen and pelvis.   Pelvic exam performed with cervical motion TTP with mild thin off white discharge in vault. Question PID? Will plan for ultrasound at this time.   Ultrasound: IMPRESSION:  1. No evidence of adnexal torsion.  2. Large uterine fibroid measuring up to 7.7 cm, which may be  partially submucosal.   Question if fibroid could be causing pain however in the setting of constipation will plan for CT scan as well  CT: IMPRESSION:  1. Trace free fluid in the pelvis may be physiologic.  2. 8.6 cm uterine fibroid.  3. No other acute localizing process identified. Note is made that  the appendix is not seen. If there is high clinical concern for  acute appendicitis, consider repeat evaluation with oral contrast.   Lower suspicion for appendicitis given overall appearance and normal labs. Per my read on CT scan pt with large amount of stool in colon consistent with constipation. Will recommend Miralax to help with same. Have recommended OBGYN follow up for  uterine fibroid however given cervical motion TTP on exam will plan for treat for PID. Pt instructed to await her gonorrhea and chlamydia results and to refrain from intercourse until she has completed treatment. She is in agreement with plan and stable for discharge home.   This note was prepared using Dragon voice recognition software and may include unintentional dictation errors due to the inherent limitations of voice recognition software.  Final Clinical Impression(s) / ED Diagnoses Final diagnoses:  Right lower quadrant abdominal pain  Constipation, unspecified constipation type  Uterine leiomyoma, unspecified location  PID (acute pelvic inflammatory disease)    Rx / DC Orders ED Discharge Orders          Ordered    doxycycline (VIBRAMYCIN) 100 MG capsule  2 times daily        11/15/20 1713             Discharge Instructions      Please pick up medication and take as prescribed. Await your gonorrhea and chlamydia test results - if positive you will need to let all partners know to be tested and treated.   Refrain from intercourse until you finish your antibiotics  I would recommend Miralax daily to help with constipation.   Please also follow up with your OBGYN regarding ED visit and for further evaluation of your uterine fibroid  Return to the ED IMMEDIATELY for any new/worsening symptoms including fevers, worsening pain, vomiting.         Eustaquio Maize, PA-C 11/15/20 1719    Regan Lemming, MD 11/15/20 Pauline Aus

## 2020-11-16 LAB — GC/CHLAMYDIA PROBE AMP (~~LOC~~) NOT AT ARMC
Chlamydia: NEGATIVE
Comment: NEGATIVE
Comment: NORMAL
Neisseria Gonorrhea: NEGATIVE

## 2020-11-16 LAB — RPR: RPR Ser Ql: NONREACTIVE

## 2021-01-10 ENCOUNTER — Encounter: Payer: 59 | Admitting: Family Medicine

## 2021-06-06 ENCOUNTER — Encounter (HOSPITAL_BASED_OUTPATIENT_CLINIC_OR_DEPARTMENT_OTHER): Payer: Self-pay | Admitting: Obstetrics and Gynecology

## 2021-06-06 ENCOUNTER — Other Ambulatory Visit: Payer: Self-pay

## 2021-06-06 NOTE — Progress Notes (Addendum)
Spoke w/ via phone for pre-op interview: patient ?Lab needs dos: UPT; no surgeon orders yet ?Lab results: NA ?COVID test: patient states asymptomatic no test needed. ?Arrive at 0900 06/09/21 ?NPO after MN except clear liquids.Clear liquids from MN until 07:45 ?Med rec completed. ?Medications to take morning of surgery: follow surgeon orders for Misoprostol ?Diabetic medication: NA ?Patient instructed no nail polish to be worn day of surgery. ?Patient instructed to bring photo id and insurance card day of surgery. ?Patient aware to have Driver (ride ) / caregiver for 24 hours after surgery. (Husband to drive) ?Patient Special Instructions: NA ?Pre-Op special Istructions: NA ?Patient verbalized understanding of instructions that were given at this phone interview. ?Patient denies shortness of breath, chest pain, fever, cough at this phone interview.  ?

## 2021-06-07 ENCOUNTER — Other Ambulatory Visit: Payer: Self-pay | Admitting: Obstetrics and Gynecology

## 2021-06-09 ENCOUNTER — Ambulatory Visit (HOSPITAL_BASED_OUTPATIENT_CLINIC_OR_DEPARTMENT_OTHER): Payer: 59 | Admitting: Anesthesiology

## 2021-06-09 ENCOUNTER — Other Ambulatory Visit: Payer: Self-pay

## 2021-06-09 ENCOUNTER — Ambulatory Visit (HOSPITAL_BASED_OUTPATIENT_CLINIC_OR_DEPARTMENT_OTHER)
Admission: RE | Admit: 2021-06-09 | Discharge: 2021-06-09 | Disposition: A | Discharge status: Home / Self Care | Payer: 59 | Attending: Obstetrics and Gynecology | Admitting: Obstetrics and Gynecology

## 2021-06-09 ENCOUNTER — Encounter (HOSPITAL_BASED_OUTPATIENT_CLINIC_OR_DEPARTMENT_OTHER): Payer: Self-pay | Admitting: Obstetrics and Gynecology

## 2021-06-09 ENCOUNTER — Encounter (HOSPITAL_BASED_OUTPATIENT_CLINIC_OR_DEPARTMENT_OTHER)
Admission: RE | Disposition: A | Discharge status: Home / Self Care | Payer: Self-pay | Source: Home / Self Care | Attending: Obstetrics and Gynecology

## 2021-06-09 DIAGNOSIS — D259 Leiomyoma of uterus, unspecified: Secondary | ICD-10-CM | POA: Diagnosis not present

## 2021-06-09 DIAGNOSIS — N920 Excessive and frequent menstruation with regular cycle: Secondary | ICD-10-CM | POA: Diagnosis present

## 2021-06-09 DIAGNOSIS — N946 Dysmenorrhea, unspecified: Secondary | ICD-10-CM | POA: Diagnosis not present

## 2021-06-09 DIAGNOSIS — J45909 Unspecified asthma, uncomplicated: Secondary | ICD-10-CM | POA: Diagnosis not present

## 2021-06-09 DIAGNOSIS — D250 Submucous leiomyoma of uterus: Secondary | ICD-10-CM | POA: Diagnosis not present

## 2021-06-09 HISTORY — PX: DILATATION & CURETTAGE/HYSTEROSCOPY WITH MYOSURE: SHX6511

## 2021-06-09 HISTORY — DX: Unspecified asthma, uncomplicated: J45.909

## 2021-06-09 HISTORY — DX: Excessive and frequent menstruation with regular cycle: N92.0

## 2021-06-09 HISTORY — DX: Anemia, unspecified: D64.9

## 2021-06-09 LAB — CBC
HCT: 33.1 % — ABNORMAL LOW (ref 36.0–46.0)
Hemoglobin: 10.2 g/dL — ABNORMAL LOW (ref 12.0–15.0)
MCH: 22.9 pg — ABNORMAL LOW (ref 26.0–34.0)
MCHC: 30.8 g/dL (ref 30.0–36.0)
MCV: 74.4 fL — ABNORMAL LOW (ref 80.0–100.0)
Platelets: 256 10*3/uL (ref 150–400)
RBC: 4.45 MIL/uL (ref 3.87–5.11)
RDW: 17.2 % — ABNORMAL HIGH (ref 11.5–15.5)
WBC: 4.2 10*3/uL (ref 4.0–10.5)
nRBC: 0 % (ref 0.0–0.2)

## 2021-06-09 LAB — POCT PREGNANCY, URINE: Preg Test, Ur: NEGATIVE

## 2021-06-09 SURGERY — RADIOFREQUENCY ABLATION, LEIOMYOMA, UTERUS, TRANSCERVICAL APPROACH, WITH US GUIDANCE
Anesthesia: General | Site: Vagina

## 2021-06-09 MED ORDER — FENTANYL CITRATE (PF) 100 MCG/2ML IJ SOLN
INTRAMUSCULAR | Status: DC | PRN
  Administered 2021-06-09: 25 ug via INTRAVENOUS
  Administered 2021-06-09: 50 ug via INTRAVENOUS
  Administered 2021-06-09 (×3): 25 ug via INTRAVENOUS
  Administered 2021-06-09: 50 ug via INTRAVENOUS

## 2021-06-09 MED ORDER — PROPOFOL 10 MG/ML IV BOLUS
INTRAVENOUS | Status: AC
  Filled 2021-06-09: qty 20

## 2021-06-09 MED ORDER — KETOROLAC TROMETHAMINE 30 MG/ML IJ SOLN
INTRAMUSCULAR | Status: DC | PRN
  Administered 2021-06-09: 30 mg via INTRAVENOUS

## 2021-06-09 MED ORDER — OXYCODONE HCL 5 MG PO TABS: 5.0000 mg | ORAL_TABLET | Freq: Once | ORAL | Status: DC | PRN

## 2021-06-09 MED ORDER — OXYCODONE HCL 5 MG/5ML PO SOLN: 5.0000 mg | Freq: Once | ORAL | Status: DC | PRN

## 2021-06-09 MED ORDER — ARTIFICIAL TEARS OPHTHALMIC OINT
TOPICAL_OINTMENT | OPHTHALMIC | Status: AC
  Filled 2021-06-09: qty 3.5

## 2021-06-09 MED ORDER — KETOROLAC TROMETHAMINE 30 MG/ML IJ SOLN
INTRAMUSCULAR | Status: AC
  Filled 2021-06-09: qty 1

## 2021-06-09 MED ORDER — ONDANSETRON HCL 4 MG/2ML IJ SOLN
INTRAMUSCULAR | Status: DC | PRN
Start: 2021-06-09 — End: 2021-06-09
  Administered 2021-06-09: 4 mg via INTRAVENOUS

## 2021-06-09 MED ORDER — POVIDONE-IODINE 10 % EX SWAB: 2.0000 "application " | Freq: Once | CUTANEOUS | Status: DC

## 2021-06-09 MED ORDER — EPHEDRINE SULFATE-NACL 50-0.9 MG/10ML-% IV SOSY
PREFILLED_SYRINGE | INTRAVENOUS | Status: DC | PRN
  Administered 2021-06-09: 10 mg via INTRAVENOUS

## 2021-06-09 MED ORDER — SODIUM CHLORIDE 0.9 % IR SOLN
Status: DC | PRN
  Administered 2021-06-09 (×2): 3000 mL

## 2021-06-09 MED ORDER — FENTANYL CITRATE (PF) 100 MCG/2ML IJ SOLN: 25.0000 ug | INTRAMUSCULAR | Status: DC | PRN

## 2021-06-09 MED ORDER — LIDOCAINE 2% (20 MG/ML) 5 ML SYRINGE
INTRAMUSCULAR | Status: DC | PRN
  Administered 2021-06-09: 60 mg via INTRAVENOUS

## 2021-06-09 MED ORDER — AMISULPRIDE (ANTIEMETIC) 5 MG/2ML IV SOLN: 10.0000 mg | Freq: Once | INTRAVENOUS | Status: DC | PRN

## 2021-06-09 MED ORDER — EPHEDRINE 5 MG/ML INJ
INTRAVENOUS | Status: AC
  Filled 2021-06-09: qty 5

## 2021-06-09 MED ORDER — OXYCODONE-ACETAMINOPHEN 5-325 MG PO TABS: 1.0000 | ORAL_TABLET | ORAL | 0 refills | Status: AC | PRN

## 2021-06-09 MED ORDER — ACETAMINOPHEN 500 MG PO TABS
1000.0000 mg | ORAL_TABLET | Freq: Once | ORAL | Status: AC
Start: 2021-06-09 — End: 2021-06-09
  Administered 2021-06-09: 1000 mg via ORAL

## 2021-06-09 MED ORDER — PROPOFOL 10 MG/ML IV BOLUS
INTRAVENOUS | Status: DC | PRN
Start: 2021-06-09 — End: 2021-06-09
  Administered 2021-06-09: 200 mg via INTRAVENOUS
  Administered 2021-06-09: 20 mg via INTRAVENOUS

## 2021-06-09 MED ORDER — KETOROLAC TROMETHAMINE 30 MG/ML IJ SOLN: 30.0000 mg | Freq: Once | INTRAMUSCULAR | Status: DC | PRN

## 2021-06-09 MED ORDER — ACETAMINOPHEN 500 MG PO TABS
ORAL_TABLET | ORAL | Status: AC
  Filled 2021-06-09: qty 2

## 2021-06-09 MED ORDER — FENTANYL CITRATE (PF) 100 MCG/2ML IJ SOLN
INTRAMUSCULAR | Status: AC
  Filled 2021-06-09: qty 2

## 2021-06-09 MED ORDER — LIDOCAINE HCL (PF) 2 % IJ SOLN
INTRAMUSCULAR | Status: AC
  Filled 2021-06-09: qty 5

## 2021-06-09 MED ORDER — IBUPROFEN 800 MG PO TABS: 800.0000 mg | ORAL_TABLET | Freq: Three times a day (TID) | ORAL | 11 refills | Status: AC | PRN

## 2021-06-09 MED ORDER — PROPOFOL 500 MG/50ML IV EMUL
INTRAVENOUS | Status: DC | PRN
  Administered 2021-06-09: 25 ug/kg/min via INTRAVENOUS

## 2021-06-09 MED ORDER — MIDAZOLAM HCL 2 MG/2ML IJ SOLN
INTRAMUSCULAR | Status: DC | PRN
  Administered 2021-06-09: 2 mg via INTRAVENOUS

## 2021-06-09 MED ORDER — STERILE WATER FOR IRRIGATION IR SOLN
Status: DC | PRN
  Administered 2021-06-09: 50 mL

## 2021-06-09 MED ORDER — ONDANSETRON HCL 4 MG/2ML IJ SOLN
INTRAMUSCULAR | Status: AC
  Filled 2021-06-09: qty 2

## 2021-06-09 MED ORDER — MIDAZOLAM HCL 2 MG/2ML IJ SOLN
INTRAMUSCULAR | Status: AC
  Filled 2021-06-09: qty 2

## 2021-06-09 MED ORDER — LACTATED RINGERS IV SOLN: INTRAVENOUS | Status: DC

## 2021-06-09 MED ORDER — PROPOFOL 500 MG/50ML IV EMUL
INTRAVENOUS | Status: AC
  Filled 2021-06-09: qty 50

## 2021-06-09 MED ORDER — DEXAMETHASONE SODIUM PHOSPHATE 10 MG/ML IJ SOLN
INTRAMUSCULAR | Status: AC
  Filled 2021-06-09: qty 1

## 2021-06-09 MED ORDER — DEXAMETHASONE SODIUM PHOSPHATE 10 MG/ML IJ SOLN
INTRAMUSCULAR | Status: DC | PRN
  Administered 2021-06-09: 10 mg via INTRAVENOUS

## 2021-06-09 SURGICAL SUPPLY — 28 items
CATH ROBINSON RED A/P 16FR (CATHETERS) ×3 IMPLANT
DEVICE MYOSURE LITE (MISCELLANEOUS) ×1 IMPLANT
DEVICE MYOSURE REACH (MISCELLANEOUS) IMPLANT
DILATOR CANAL MILEX (MISCELLANEOUS) IMPLANT
DRSG TELFA 3X8 NADH (GAUZE/BANDAGES/DRESSINGS) ×3 IMPLANT
ELECT DISPERSIVE SONATA (ELECTRODE) ×6 IMPLANT
ELECT REM PT RETURN 9FT ADLT (ELECTROSURGICAL) ×3
ELECTRODE REM PT RTRN 9FT ADLT (ELECTROSURGICAL) ×2 IMPLANT
GAUZE 4X4 16PLY ~~LOC~~+RFID DBL (SPONGE) ×3 IMPLANT
GLOVE BIOGEL PI IND STRL 7.0 (GLOVE) ×2 IMPLANT
GLOVE BIOGEL PI INDICATOR 7.0 (GLOVE) ×1
GLOVE ECLIPSE 6.5 STRL STRAW (GLOVE) ×3 IMPLANT
GOWN STRL REUS W/TWL LRG LVL3 (GOWN DISPOSABLE) ×3 IMPLANT
HANDPIECE RFA SONATA (MISCELLANEOUS) ×3 IMPLANT
IV NS IRRIG 3000ML ARTHROMATIC (IV SOLUTION) ×4 IMPLANT
KIT PROCEDURE FLUENT (KITS) ×3 IMPLANT
KIT TURNOVER CYSTO (KITS) ×3 IMPLANT
MYOSURE XL FIBROID (MISCELLANEOUS)
PACK VAGINAL MINOR WOMEN LF (CUSTOM PROCEDURE TRAY) ×3 IMPLANT
PAD DRESSING TELFA 3X8 NADH (GAUZE/BANDAGES/DRESSINGS) ×2 IMPLANT
PAD OB MATERNITY 4.3X12.25 (PERSONAL CARE ITEMS) ×3 IMPLANT
PAD PREP 24X48 CUFFED NSTRL (MISCELLANEOUS) ×3 IMPLANT
SEAL CERVICAL OMNI LOK (ABLATOR) IMPLANT
SEAL ROD LENS SCOPE MYOSURE (ABLATOR) ×3 IMPLANT
SYR 50ML LL SCALE MARK (SYRINGE) ×3 IMPLANT
SYSTEM TISS REMOVAL MYOSURE XL (MISCELLANEOUS) IMPLANT
TOWEL OR 17X26 10 PK STRL BLUE (TOWEL DISPOSABLE) ×3 IMPLANT
WATER STERILE IRR 500ML POUR (IV SOLUTION) ×3 IMPLANT

## 2021-06-09 NOTE — Discharge Instructions (Addendum)
?  Post Anesthesia Home Care Instructions ? ?Activity: ?Get plenty of rest for the remainder of the day. A responsible individual must stay with you for 24 hours following the procedure.  ?For the next 24 hours, DO NOT: ?-Drive a car ?-Paediatric nurse ?-Drink alcoholic beverages ?-Take any medication unless instructed by your physician ?-Make any legal decisions or sign important papers. ? ?Meals: ?Start with liquid foods such as gelatin or soup. Progress to regular foods as tolerated. Avoid greasy, spicy, heavy foods. If nausea and/or vomiting occur, drink only clear liquids until the nausea and/or vomiting subsides. Call your physician if vomiting continues. ? ?Special Instructions/Symptoms: ?Your throat may feel dry or sore from the anesthesia or the breathing tube placed in your throat during surgery. If this causes discomfort, gargle with warm salt water. The discomfort should disappear within 24 hours. ? ?   DISCHARGE INSTRUCTIONS: HYSTEROSCOPY / ENDOMETRIAL ABLATION ?The following instructions have been prepared to help you care for yourself upon your return home. ? ?No Tylenol until 4:00 p.m. ? ?May take Ibuprofen after 5:00 p.m. ? ?May take stool softner while taking narcotic pain medication to prevent constipation.  Drink plenty of water. ? ?Personal hygiene: ? Use sanitary pads for vaginal drainage, not tampons. ? Shower the day after your procedure. ? NO tub baths, pools or Jacuzzis for 2-3 weeks. ? Wipe front to back after using the bathroom. ? ?Activity and limitations: ? Do NOT drive or operate any equipment for 24 hours. The effects of anesthesia are still present ?and drowsiness may result. ? Do NOT rest in bed all day. ? Walking is encouraged. ? Walk up and down stairs slowly. ? You may resume your normal activity in one to two days or as indicated by your physician. ?Sexual activity: NO intercourse for at least 2 weeks after the procedure, or as indicated by your ?Doctor. ? ?Diet: Eat a light  meal as desired this evening. You may resume your usual diet tomorrow. ? ?Return to Work: You may resume your work activities in one to two days or as indicated by your ?Doctor. ? ?What to expect after your surgery: Expect to have vaginal bleeding/discharge for 2-3 days and ?spotting for up to 10 days. It is not unusual to have soreness for up to 1-2 weeks. You may have a ?slight burning sensation when you urinate for the first day. Mild cramps may continue for a couple of ?days. You may have a regular period in 2-6 weeks. ? ?Call your doctor for any of the following: ? Excessive vaginal bleeding or clotting, saturating and changing one pad every hour. ? Inability to urinate 6 hours after discharge from hospital. ? Pain not relieved by pain medication. ? Fever of 100.4? F or greater. ? Unusual vaginal discharge or odor. ? ?

## 2021-06-09 NOTE — Anesthesia Preprocedure Evaluation (Addendum)
Anesthesia Evaluation  ?Patient identified by MRN, date of birth, ID band ?Patient awake ? ? ? ?Reviewed: ?Allergy & Precautions, NPO status , Patient's Chart, lab work & pertinent test results ? ?Airway ?Mallampati: II ? ?TM Distance: >3 FB ?Neck ROM: Full ? ? ? Dental ?no notable dental hx. ? ?  ?Pulmonary ?asthma ,  ?  ?Pulmonary exam normal ? ? ? ? ? ? ? Cardiovascular ?negative cardio ROS ?Normal cardiovascular exam ? ? ?  ?Neuro/Psych ?negative neurological ROS ? negative psych ROS  ? GI/Hepatic ?negative GI ROS, (+)  ?  ? substance abuse ? ,   ?Endo/Other  ?negative endocrine ROS ? Renal/GU ?negative Renal ROS  ? ?  ?Musculoskeletal ?negative musculoskeletal ROS ?(+)  ? Abdominal ?(+) + obese,   ?Peds ? Hematology ?negative hematology ROS ?(+)   ?Anesthesia Other Findings ?Dysmenorrhea  ?Uterine fibroid ? Reproductive/Obstetrics ?hcg negative ? ?  ? ? ? ? ? ? ? ? ? ? ? ? ? ?  ?  ? ? ? ? ? ? ? ?Anesthesia Physical ?Anesthesia Plan ? ?ASA: 2 ? ?Anesthesia Plan: General  ? ?Post-op Pain Management:   ? ?Induction: Intravenous ? ?PONV Risk Score and Plan: 4 or greater and Ondansetron, Dexamethasone, Propofol infusion, Midazolam and Treatment may vary due to age or medical condition ? ?Airway Management Planned: LMA ? ?Additional Equipment:  ? ?Intra-op Plan:  ? ?Post-operative Plan: Extubation in OR ? ?Informed Consent: I have reviewed the patients History and Physical, chart, labs and discussed the procedure including the risks, benefits and alternatives for the proposed anesthesia with the patient or authorized representative who has indicated his/her understanding and acceptance.  ? ? ? ?Dental advisory given ? ?Plan Discussed with: CRNA ? ?Anesthesia Plan Comments:   ? ? ? ? ? ?Anesthesia Quick Evaluation ? ?

## 2021-06-09 NOTE — Transfer of Care (Signed)
Immediate Anesthesia Transfer of Care Note ? ?Patient: Monique Middleton ? ?Procedure(s) Performed: Procedure(s) (LRB): ?Radio Frequency Ablation with Sonata (N/A) ?DILATATION & CURETTAGE/HYSTEROSCOPY WITH MYOSURE (N/A) ? ?Patient Location: PACU ? ?Anesthesia Type: General ? ?Level of Consciousness: awake, alert  and oriented ? ?Airway & Oxygen Therapy: Patient Spontanous Breathing  ? ?Post-op Assessment: Report given to PACU RN and Post -op Vital signs reviewed and stable ? ?Post vital signs: Reviewed and stable ? ?Complications: No apparent anesthesia complications ? ?Last Vitals:  ?Vitals Value Taken Time  ?BP 132/82 06/09/21 1205  ?Temp    ?Pulse 96 06/09/21 1207  ?Resp 21 06/09/21 1208  ?SpO2 100 % 06/09/21 1207  ?Vitals shown include unvalidated device data. ? ?Last Pain:  ?Vitals:  ? 06/09/21 0959  ?TempSrc: Oral  ?PainSc: 0-No pain  ?   ? ?Patients Stated Pain Goal: 4 (06/09/21 0959) ? ?Complications: No notable events documented. ?

## 2021-06-09 NOTE — H&P (Signed)
Monique Middleton is an 31 y.o. female. G0 BF presents for surgical mgmt of symptomatic uterine fibroids  using Radiofrequency transcervical ablation of uterine fibroids using Sonata, dx hysteroscopy, hysteroscopic resection of SM fibroid, D&C ?Pertinent Gynecological History: ?Menses: flow is excessive with use of 5 pads or tampons on heaviest days ?Bleeding: menorrhagia ?Contraception: none ?DES exposure: denies ?Blood transfusions: none ?Sexually transmitted diseases: no past history ?Previous GYN Procedures:  n/a   ?Last mammogram:  n/a  Date: n/a ?Last pap: normal Date: 2023 ?OB History: G0, P0  ? ?Menstrual History: ?Menarche age: n/a ?Patient's last menstrual period was 05/28/2021 (exact date). ?  ? ?Past Medical History:  ?Diagnosis Date  ? Anemia   ? Asthma   ? exercise induced  ? Menorrhagia with regular cycle 06/09/2021  ? Pituitary adenoma ([place])   ? ? ?Past Surgical History:  ?Procedure Laterality Date  ? UMBILICAL HERNIA REPAIR    ? ? ?Family History  ?Problem Relation Age of Onset  ? Other Neg Hx   ?     pituitary disorder  ? ? ?Social History:  reports that she has never smoked. She has never used smokeless tobacco. She reports current alcohol use of about 3.0 standard drinks per week. She reports current drug use. Drug: Marijuana. ? ?Allergies:  ?Allergies  ?Allergen Reactions  ? Amoxicillin   ? ? ?No medications prior to admission.  ? ? ?Review of Systems  ?All other systems reviewed and are negative. ? ?Height '5\' 5"'$  (1.651 m), weight 95.7 kg, last menstrual period 05/28/2021. ?Physical Exam ?Constitutional:   ?   Appearance: Normal appearance.  ?HENT:  ?   Head: Atraumatic.  ?Eyes:  ?   Extraocular Movements: Extraocular movements intact.  ?Cardiovascular:  ?   Rate and Rhythm: Regular rhythm.  ?   Heart sounds: Normal heart sounds.  ?Pulmonary:  ?   Breath sounds: Normal breath sounds.  ?Abdominal:  ?   Palpations: Abdomen is soft.  ?Genitourinary: ?   General: Normal vulva.  ?   Comments: Vagina;  nl ?Cervix closed ? Uterus 10 wk irregular enlarged ?Adnexa nl ?Musculoskeletal:     ?   General: Normal range of motion.  ?   Cervical back: Neck supple.  ?Skin: ?   General: Skin is warm and dry.  ?Neurological:  ?   General: No focal deficit present.  ?   Mental Status: She is alert and oriented to person, place, and time.  ?Psychiatric:     ?   Mood and Affect: Mood normal.     ?   Behavior: Behavior normal.  ? ? ?No results found for this or any previous visit (from the past 24 hour(s)). ? ?No results found. ? ?Assessment/Plan: ?Menorrhagia with regular cycles ?Uterine fibroids ?P)radiofrequency transcervical ablation of uterine fibroid using Sonata, diagnostic hysteroscopy, hysteroscopic resection of submucosal fibroid,dilation and curettage. Procedure explained. Risk of surgery reviewed including infection, bleeding, injury to surrounding organ structures, thermal injury, inability to complete procedure due to location of fibroid, fluid overload and its risk, possible need for blood transfusion and its risk( HIv, hepatitis, acute rxn). All ? answered ? ?Sharaine Delange A Kyia Rhude ?06/09/2021, 8:25 AM ? ?

## 2021-06-09 NOTE — Anesthesia Procedure Notes (Signed)
Procedure Name: LMA Insertion ?Date/Time: 06/09/2021 10:30 AM ?Performed by: Mechele Claude, CRNA ?Pre-anesthesia Checklist: Patient identified, Emergency Drugs available, Suction available and Patient being monitored ?Patient Re-evaluated:Patient Re-evaluated prior to induction ?Oxygen Delivery Method: Circle system utilized ?Preoxygenation: Pre-oxygenation with 100% oxygen ?Induction Type: IV induction ?Ventilation: Mask ventilation without difficulty ?LMA: LMA inserted ?LMA Size: 4.0 ?Number of attempts: 1 ?Airway Equipment and Method: Bite block ?Placement Confirmation: positive ETCO2 ?Tube secured with: Tape ?Dental Injury: Teeth and Oropharynx as per pre-operative assessment  ? ? ? ? ?

## 2021-06-09 NOTE — Progress Notes (Signed)
Patient up to bathroom

## 2021-06-09 NOTE — Interval H&P Note (Signed)
History and Physical Interval Note: ? ?06/09/2021 ?9:48 AM ? ?Monique Middleton  has presented today for surgery, with the diagnosis of Dysmenorrhea, uterine fibroid.  The various methods of treatment have been discussed with the patient and family. After consideration of risks, benefits and other options for treatment, the patient has consented to Procedure: radiofrequency  transcervical ablation of uterine fibroids using Sonata, diagnostic hysteroscopy, D&C, hysteroscopic resection of submucosal fibroid using myosure as a surgical intervention.  The patient's history has been reviewed, patient examined, no change in status, stable for surgery.  I have reviewed the patient's chart and labs.  Questions were answered to the patient's satisfaction.   ? ? ?Carnell Beavers A Alexie Samson ? ? ?

## 2021-06-09 NOTE — Brief Op Note (Signed)
06/09/2021 ? ?12:16 PM ? ?PATIENT:  Monique Middleton  31 y.o. female ? ?PRE-OPERATIVE DIAGNOSIS:   menorrhagia with regualr cycles,  uterine fibroid, dysmenorrhea ? ?POST-OPERATIVE DIAGNOSIS:  menorrhagia with regular cycles, uterine fibroids( submucosal, intramural, subserosal), dysmenorrhea ? ?PROCEDURE:  radiofrequency transcervical ablation of uterine fibroid using Sonata, diagnostic hysteroscopy, dilation and curettage ? ?SURGEON:  Surgeon(s) and Role: ?   Servando Salina, MD - Primary ? ?PHYSICIAN ASSISTANT:  ? ?ASSISTANTS: none  ? ?ANESTHESIA:   general ?Findings: 2 large fibroids( amt and post extending to sm location bilateral. Both tubal ostia seen ?EBL:  10 mL  ? ?BLOOD ADMINISTERED:none ? ?DRAINS: none  ? ?LOCAL MEDICATIONS USED:  NONE ? ?SPECIMEN:  Source of Specimen:  [facility] ? ?DISPOSITION OF SPECIMEN:  PATHOLOGY ? ?COUNTS:  YES ? ?TOURNIQUET:  * No tourniquets in log * ? ?DICTATION: .Other Dictation: Dictation Number [id number] ? ?PLAN OF CARE: Discharge to home after PACU ? ?PATIENT DISPOSITION:  PACU - hemodynamically stable. ?  ?Delay start of Pharmacological VTE agent (>24hrs) due to surgical blood loss or risk of bleeding: no ? ?

## 2021-06-09 NOTE — Anesthesia Postprocedure Evaluation (Signed)
Anesthesia Post Note ? ?Patient: Celestial Barnfield ? ?Procedure(s) Performed: Radio Frequency Ablation with Sonata (Uterus) ?DILATATION & CURETTAGE/HYSTEROSCOPY WITH MYOSURE (Vagina ) ? ?  ? ?Patient location during evaluation: PACU ?Anesthesia Type: General ?Level of consciousness: awake ?Pain management: pain level controlled ?Vital Signs Assessment: post-procedure vital signs reviewed and stable ?Respiratory status: spontaneous breathing, nonlabored ventilation, respiratory function stable and patient connected to nasal cannula oxygen ?Cardiovascular status: blood pressure returned to baseline and stable ?Postop Assessment: no apparent nausea or vomiting ?Anesthetic complications: no ? ? ?No notable events documented. ? ?Last Vitals:  ?Vitals:  ? 06/09/21 1247 06/09/21 1311  ?BP:  (!) 125/98  ?Pulse:  82  ?Resp: 16 16  ?Temp:  (!) 36.3 ?C  ?SpO2:  100%  ?  ?Last Pain:  ?Vitals:  ? 06/09/21 1311  ?TempSrc:   ?PainSc: 3   ? ? ?  ?  ?  ?  ?  ?  ? ?Allea Kassner P Isaia Hassell ? ? ? ? ?

## 2021-06-10 NOTE — Op Note (Signed)
NAME: Monique Middleton, Monique Middleton ?MEDICAL RECORD NO: [id number] ?ACCOUNT NO: [id number] ?DATE OF BIRTH: 08-08-1990 ?FACILITY: [facility] ?LOCATION: [facility] ?PHYSICIAN: Izaah Westman A. Garwin Brothers, MD ? ?Operative Report  ? ?DATE OF PROCEDURE: 06/09/2021 ? ?PREOPERATIVE DIAGNOSES:  Menorrhagia with regular cycle, dysmenorrhea, uterine fibroids. ? ?PROCEDURE:  Radiofrequency transcervical ablation of uterine fibroids using Sonata, diagnostic hysteroscopy, Dilation and Curettage. ? ?POSTOPERATIVE DIAGNOSES:  Menorrhagia with regular cycle, dysmenorrhea, uterine fibroids. ? ?ANESTHESIA:  General. ? ?SURGEON:  Alpha Mysliwiec A. Garwin Brothers, MD ? ?ASSISTANT:  None. ? ?PROCEDURE DETAILS:  Under adequate general anesthesia, the patient was placed in dorsal lithotomy position.  She was sterilely prepped and draped in the usual fashion.  The bladder was not catheterized as the patient had voided prior to entering the  ?room.  Examination under anesthesia revealed a 12-week size uterus.  No adnexal masses could be appreciated.  Bivalve speculum was placed in the vagina.  A single-tooth tenaculum was placed on the anterior lip of the cervix.  Cervix was then serially  ?dilated up to the #[facility] dilator.  A diagnostic hysteroscope was introduced into the uterine cavity.  Of note, the lower segment on the right as well as the left lateral aspect of the uterus was distorted with evidence of a submucosal fibroids.  Both  ?tubal ostia could be seen.  The hysteroscope was removed.  The Sonata treatment device was inserted and a panoramic inspection of the uterus was then performed. Two large fibroids were noted evidenced close to the endometrial cavity. With mapping of the  ?fibroids, the procedure was started with the posterior  six centimeter large fibroid at the six o'clock position:  where 3 ablation procedures were performed. Respectively, the ablation sizes were as follows: size 2.6 x 1.9 cm( 2 min 18 seconds), ablation size : 2.7 x 2.0 cm( 2 min 30  seconds) and 3rd ablation size: 2.9 x 2.2 cm ( 2 min 54 seconds).   Once that was done, the second fibroid was then identified measuring 8 cm at the 10 o'clock position for type 4/5  as well and 2 ablation procedures performed  : ablation size 2.6 x 1.9 cm( 2 min 18 seconds) and ablation size 2.8 x 2.0 cm (2 min 30 seconds), taking care not to affect the  ?endometrial cavity. All cycles were carried out under direct ultrasound intrauterine guidance and visualization with the ablation guide noted to be within the serosa at all time. The fibroids treated appeared ablated with the ultrasound guidance with outgassing was noted  by ultrasound appearance. The treatment device was removed.  The hysteroscope was reinserted.  No evidence of ablation of the endometrial cavity was noted.  Using the LITE resectoscope, the endometrium  ?was resected and subsequently all instruments were then removed from the vagina.  Specimen labeled endometrial curetting was sent to pathology.  Fluid deficit was 900 mL. Complication was none.  The patient tolerated the procedure well and was transferred to recovery room in stable condition. ? ? ?VAI ?D: 06/09/2021 11:47:56 pm T: 06/10/2021 1:22:00 am  ?JOB: [id number]/ [id number]  ?

## 2021-06-12 ENCOUNTER — Encounter (HOSPITAL_BASED_OUTPATIENT_CLINIC_OR_DEPARTMENT_OTHER): Payer: Self-pay | Admitting: Obstetrics and Gynecology

## 2021-06-12 LAB — SURGICAL PATHOLOGY

## 2022-04-03 ENCOUNTER — Ambulatory Visit: Payer: 59 | Admitting: Family Medicine

## 2022-04-30 ENCOUNTER — Ambulatory Visit: Payer: 59 | Admitting: Family Medicine

## 2022-05-03 ENCOUNTER — Ambulatory Visit: Payer: 59 | Admitting: Family Medicine

## 2022-07-24 ENCOUNTER — Ambulatory Visit: Payer: 59 | Admitting: Family Medicine

## 2022-09-18 IMAGING — CT CT ABD-PELV W/ CM
2 of 4 series · 16 of 46 positions shown, 18 images · IV contrast (APPLIED)
Comparison: Ultrasound pelvis 11/15/2020

CLINICAL DATA: Acute abdominal pain.

EXAM:
CT ABDOMEN AND PELVIS WITH CONTRAST
TECHNIQUE: Multidetector CT imaging of the abdomen and pelvis was performed
using the standard protocol following bolus administration of
intravenous contrast.
CONTRAST:  100mL OMNIPAQUE IOHEXOL 300 MG/ML  SOLN

[Series 2: abd pel w · axial · 0.87mm/px · z∈[+743,+1153]mm · 13 of 90 slices shown, 15 images]
[im 4/90  soft-tissue]
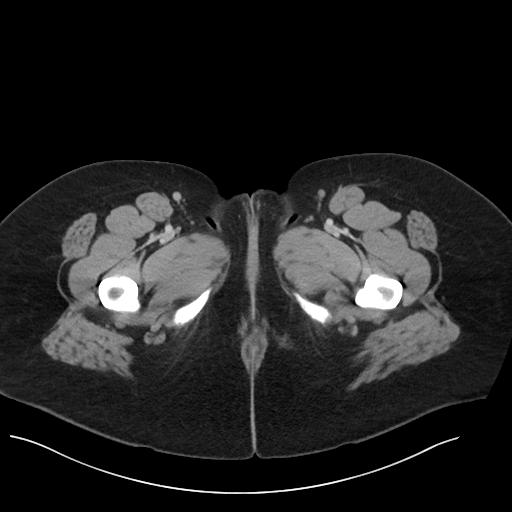
[im 4/90  bone]
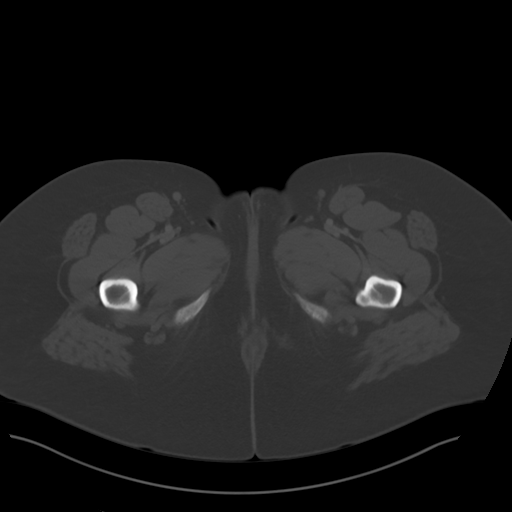
[im 11/90  soft-tissue]
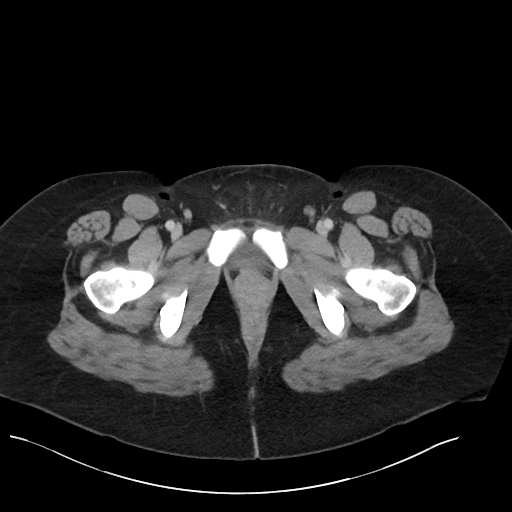
[im 18/90  soft-tissue]
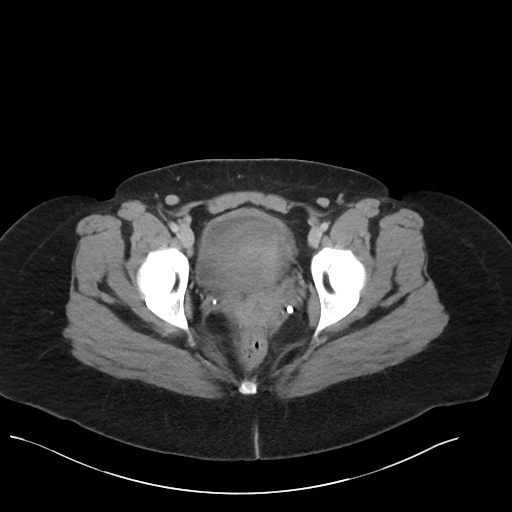
[im 25/90  soft-tissue]
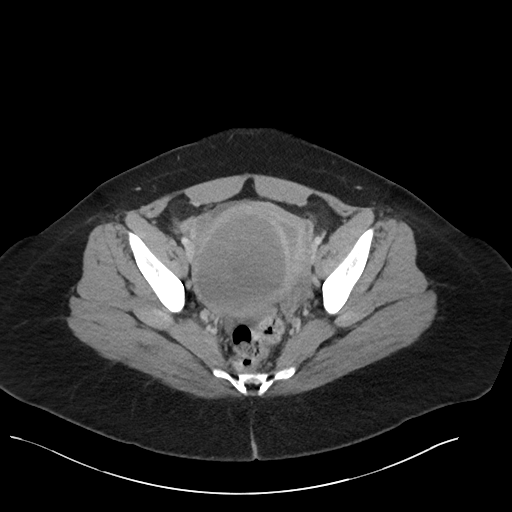
[im 33/90  soft-tissue]
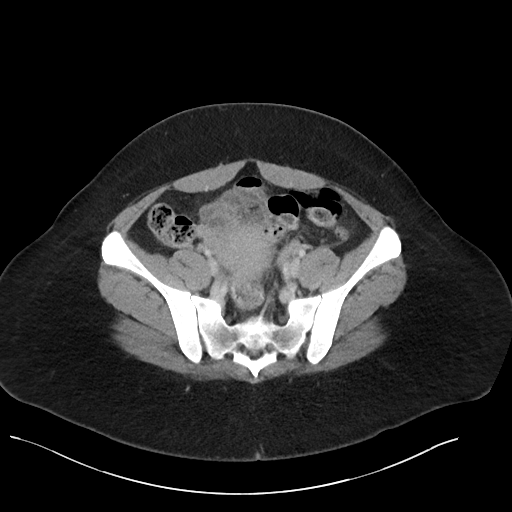
[im 40/90  soft-tissue]
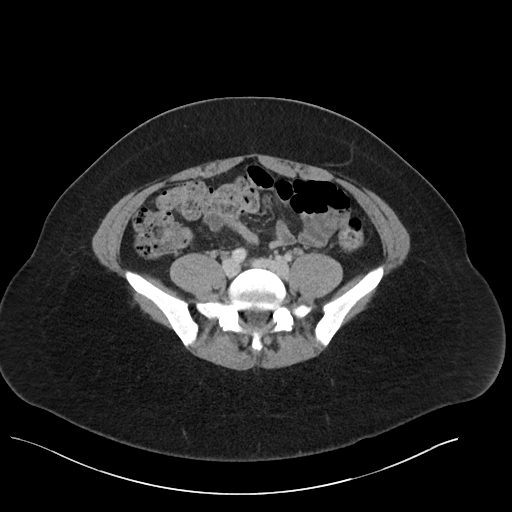
[im 47/90  soft-tissue]
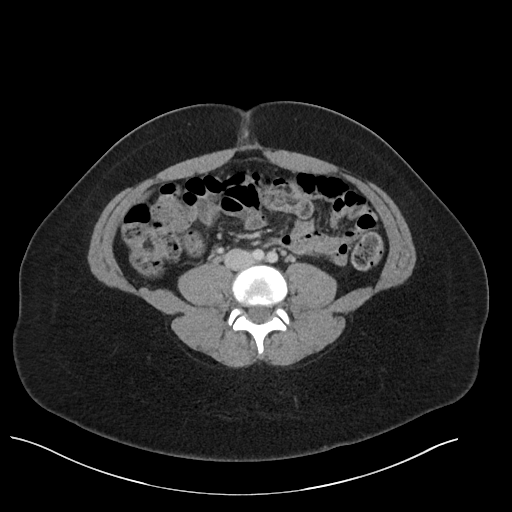
[im 50/90  soft-tissue]
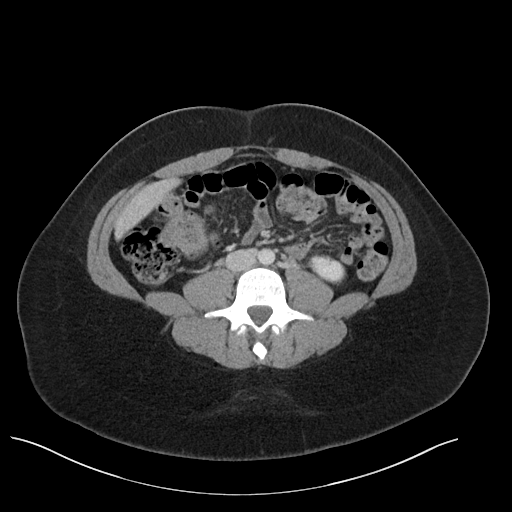
[im 57/90  soft-tissue]
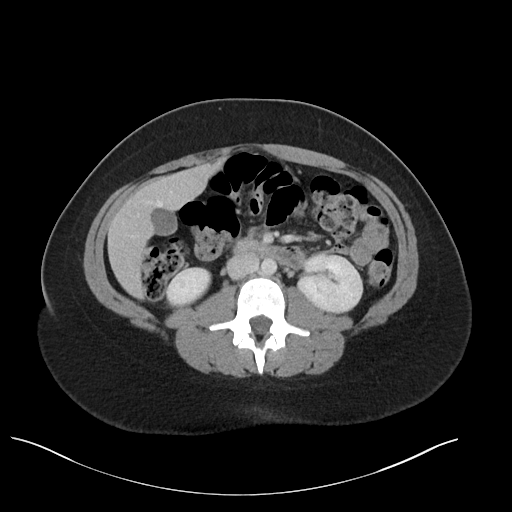
[im 57/90  bone]
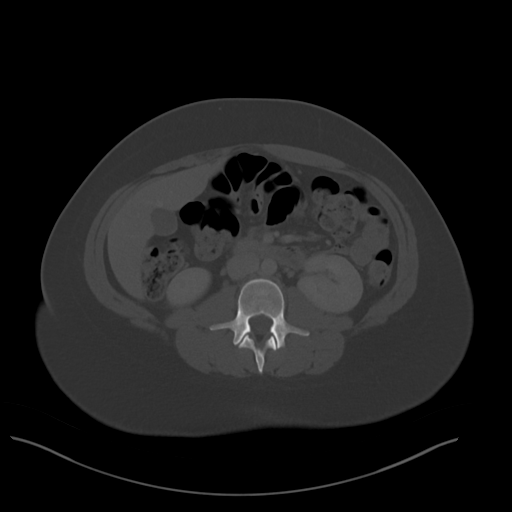
[im 65/90  soft-tissue]
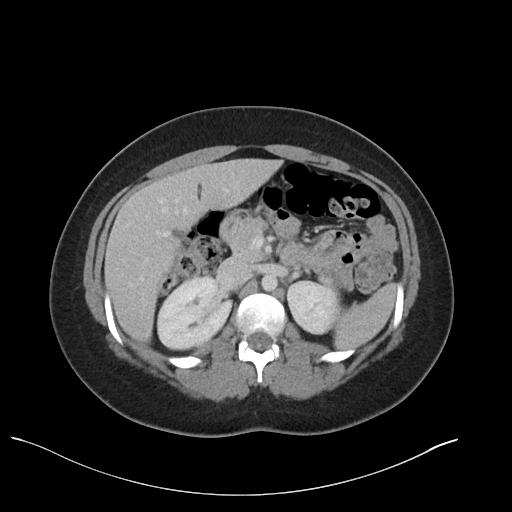
[im 72/90  soft-tissue]
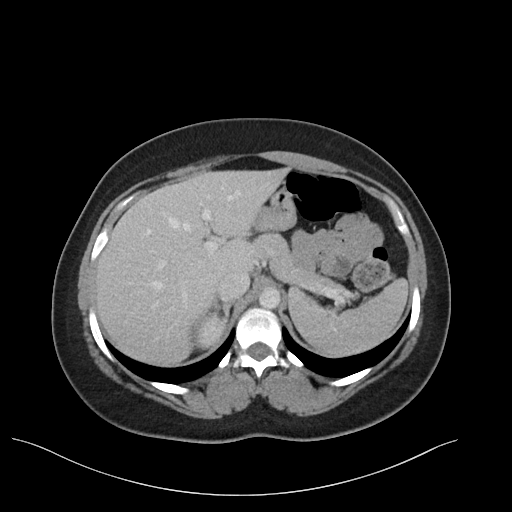
[im 79/90  soft-tissue]
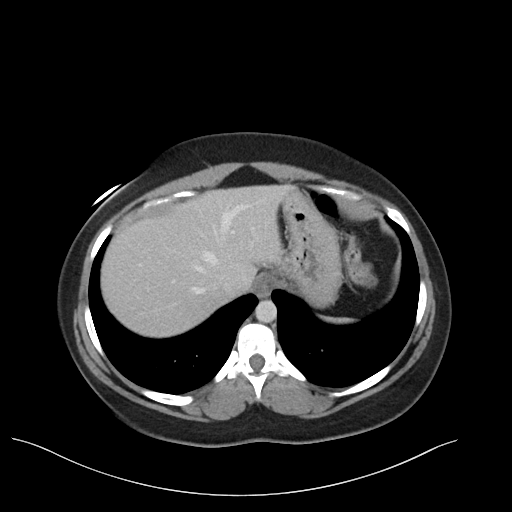
[im 86/90  soft-tissue]
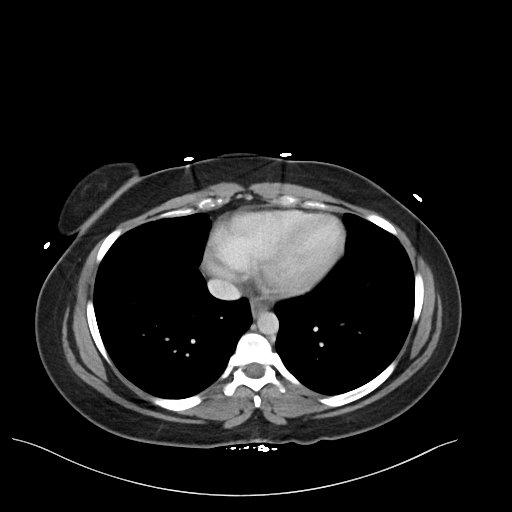

[Series 5: coronal · coronal · 0.84mm/px · 3 of 109 slices shown]
[im 37/109  soft-tissue]
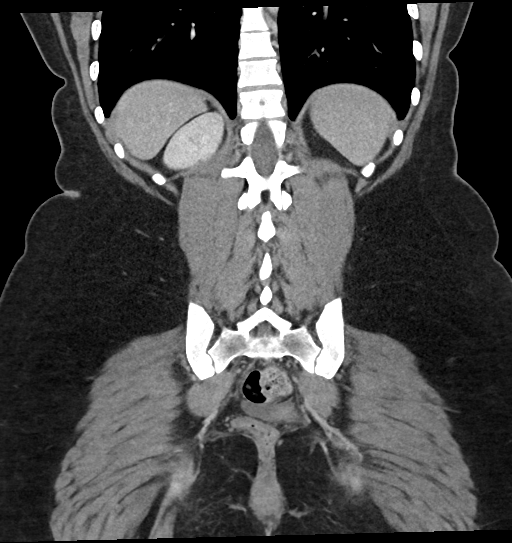
[im 49/109  soft-tissue]
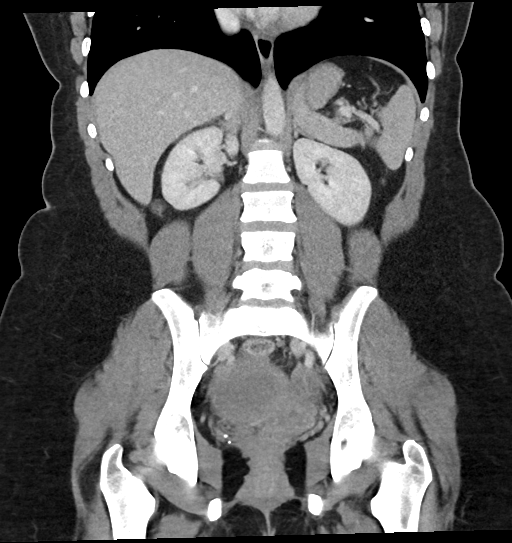
[im 61/109  soft-tissue]
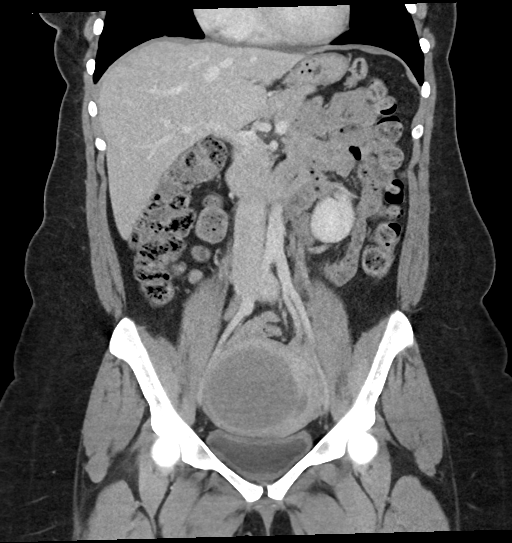

[16 of 46 positions shown; findings below may reference images not displayed]

FINDINGS: Lower chest: No acute abnormality.

Hepatobiliary: No focal liver abnormality is seen. No gallstones,
gallbladder wall thickening, or biliary dilatation.

Pancreas: Unremarkable. No pancreatic ductal dilatation or
surrounding inflammatory changes.

Spleen: Normal in size without focal abnormality.

Adrenals/Urinary Tract: Adrenal glands are unremarkable. Kidneys are
normal, without renal calculi, focal lesion, or hydronephrosis.
Bladder is unremarkable.

Stomach/Bowel: Stomach is within normal limits. Appendix is not
seen. No evidence of bowel wall thickening, distention, or
inflammatory changes.

Vascular/Lymphatic: No significant vascular findings are present. No
enlarged abdominal or pelvic lymph nodes.

Reproductive: 8.6 x 7.5 cm hypodense area in the uterus compatible
with fibroid seen on recent ultrasound. Adnexa are unremarkable.

Other: Trace free fluid in the pelvis. No focal abdominal wall
hernia. Surgical clips are seen in the right upper quadrant adjacent
to the left lobe of the liver.

Musculoskeletal: No acute or significant osseous findings.
IMPRESSION: 1. Trace free fluid in the pelvis may be physiologic.
2. 8.6 cm uterine fibroid.
3. No other acute localizing process identified. Note is made that
the appendix is not seen. If there is high clinical concern for
acute appendicitis, consider repeat evaluation with oral contrast.

## 2022-09-18 IMAGING — US US PELVIS COMPLETE TRANSABD/TRANSVAG W DUPLEX
1 series · 13 of 25 positions shown · non-contrast
Comparison: None.

CLINICAL DATA: Lower abdominal pain and pressure for 2-3 days

EXAM:
TRANSABDOMINAL AND TRANSVAGINAL ULTRASOUND OF PELVIS
DOPPLER ULTRASOUND OF OVARIES
TECHNIQUE: Both transabdominal and transvaginal ultrasound examinations of the
pelvis were performed. Transabdominal technique was performed for
global imaging of the pelvis including uterus, ovaries, adnexal
regions, and pelvic cul-de-sac.
It was necessary to proceed with endovaginal exam following the
transabdominal exam to visualize the ovaries. Color and duplex
Doppler ultrasound was utilized to evaluate blood flow to the
ovaries.

[Series 1: us pelvic complete w transvaginal and torsion righ · 13 of 73 slices shown]
[im 1/73]
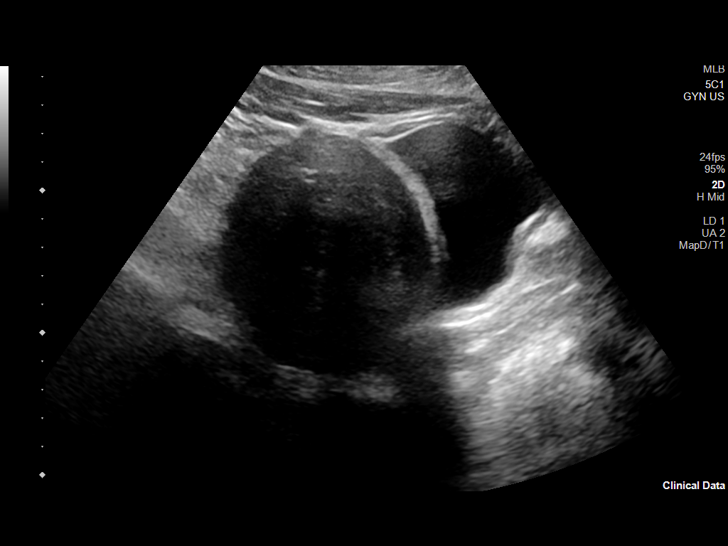
[im 7/73]
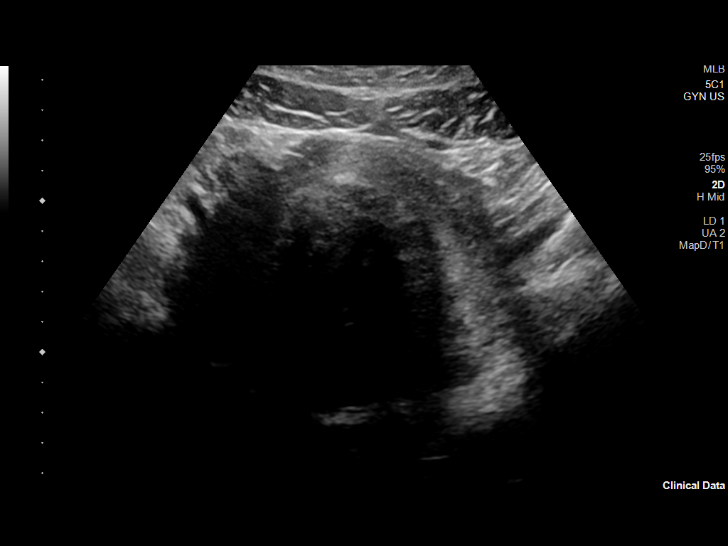
[im 13/73]
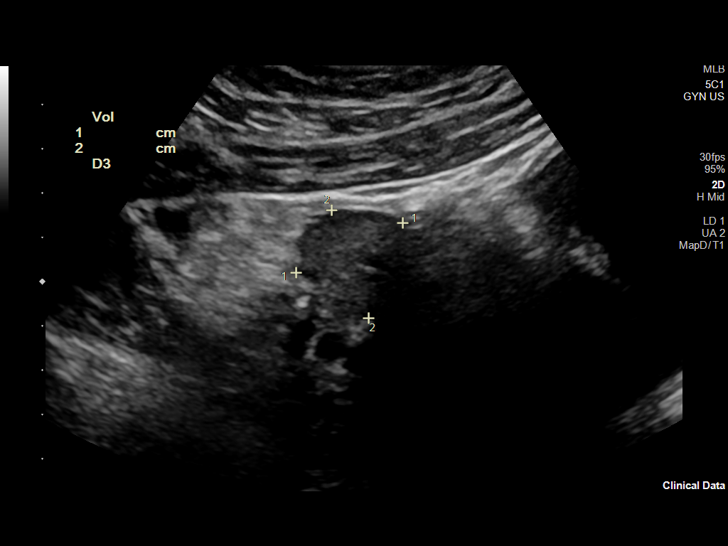
[im 19/73]
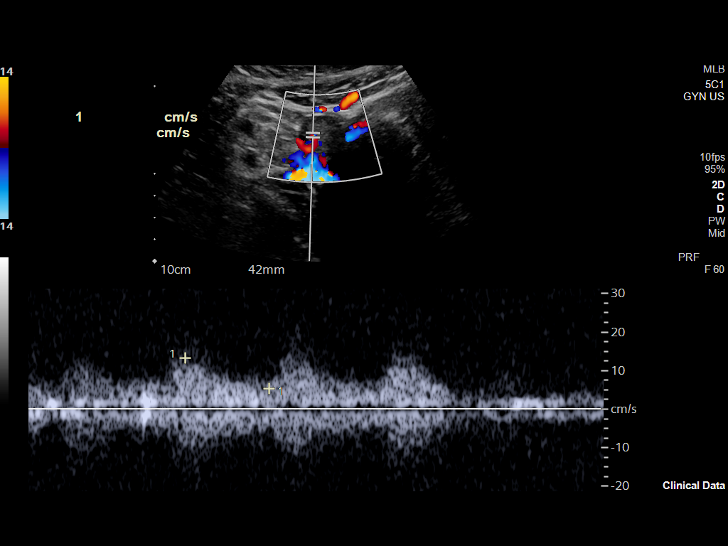
[im 25/73]
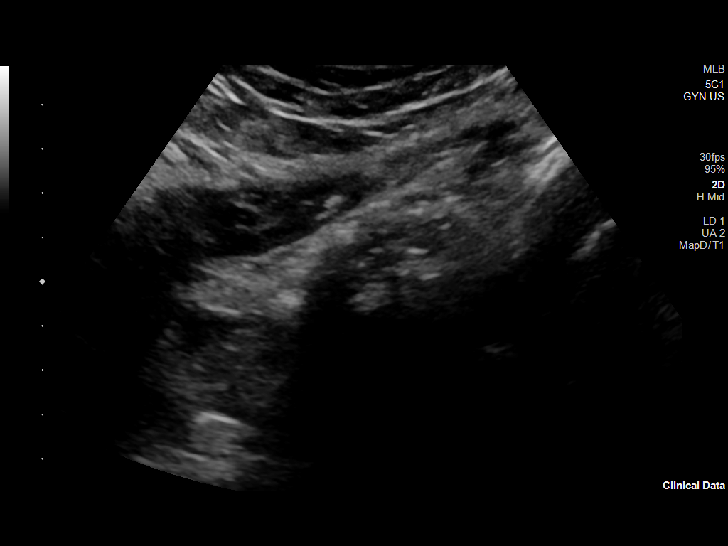
[im 31/73]
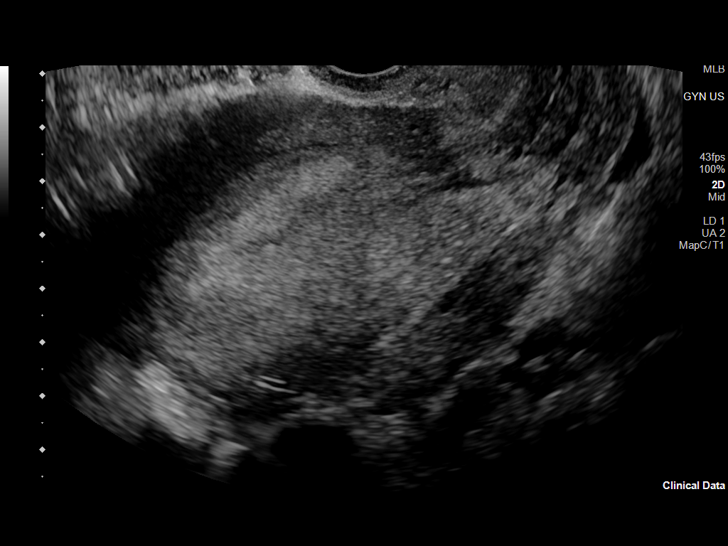
[im 37/73]
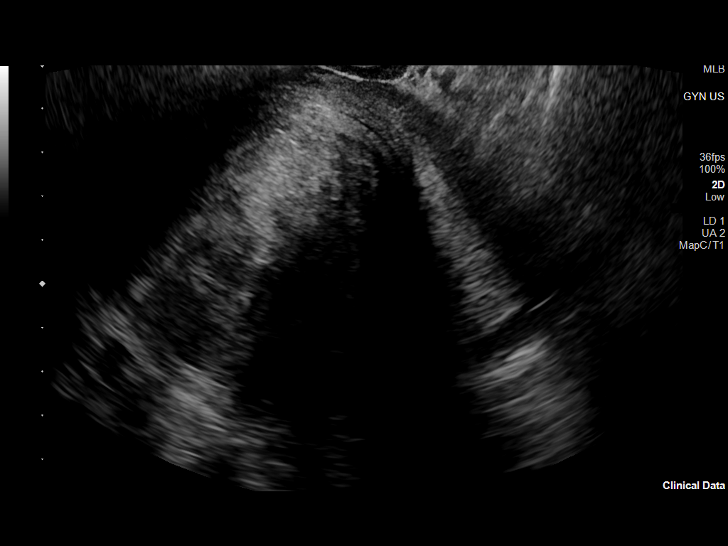
[im 43/73]
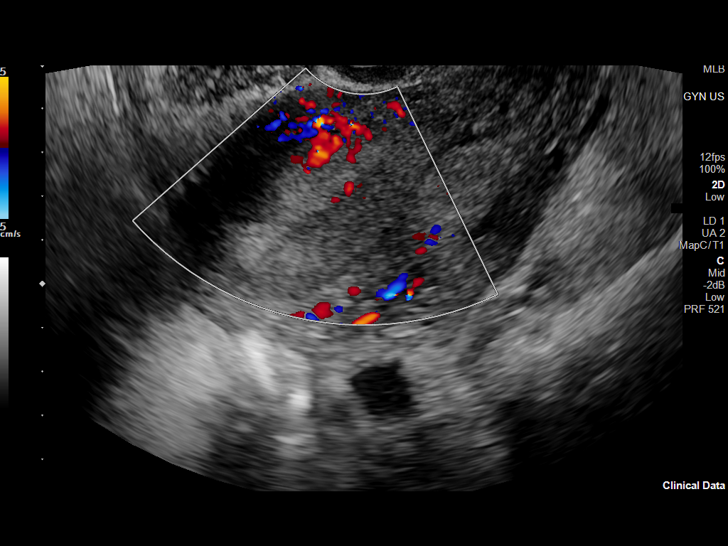
[im 49/73]
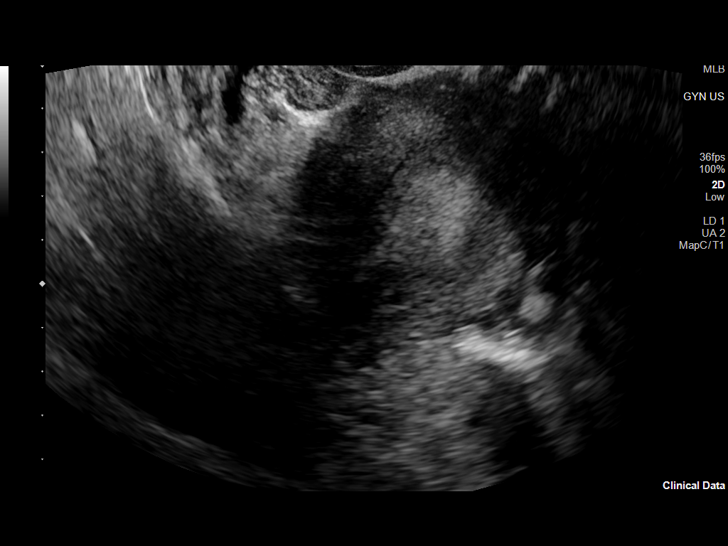
[im 55/73]
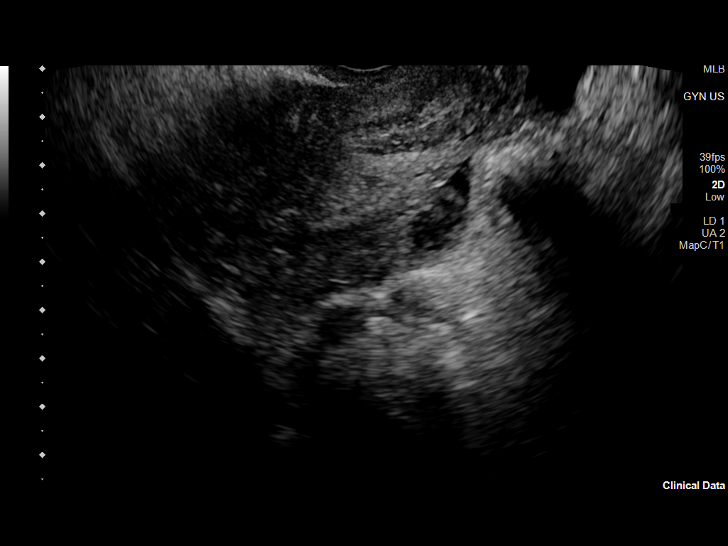
[im 61/73]
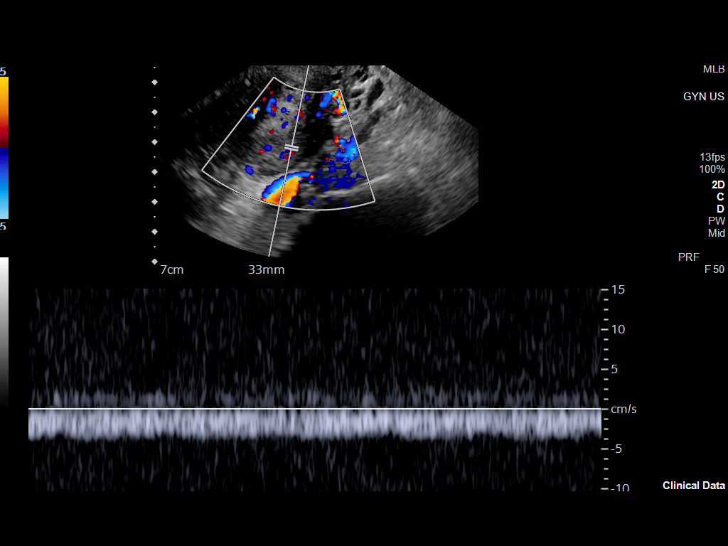
[im 67/73]
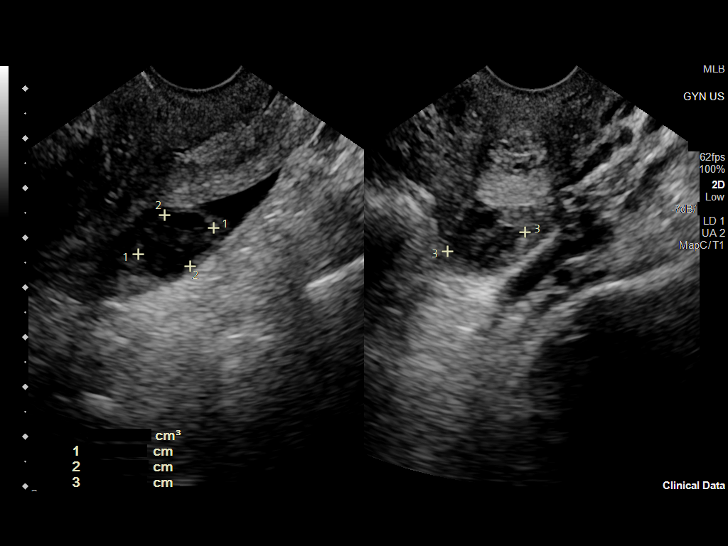
[im 73/73]
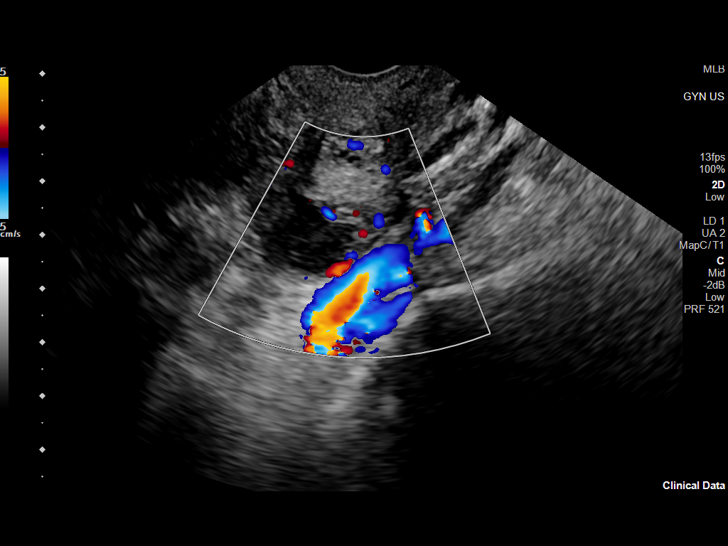

[13 of 25 positions shown; findings below may reference images not displayed]

FINDINGS: Uterus

Measurements: 10.9 x 8.3 x 9.0 cm = volume: 425 mL. Anteverted.
Large fibroid measuring 7.7 x 6.9 x 7.5 cm, which may be partially
submucosal. Small nabothian cysts at the level of the cervix.

Endometrium

Thickness: 18 mm.  No focal abnormality visualized.

Right ovary

Measurements: 2.7 x 2.6 x 2.2 cm = volume: 8 mL. Normal
appearance/no adnexal mass.

Left ovary

Measurements: 4.4 x 1.4 x 3.2 cm = volume: 10 mL. Normal
appearance/no adnexal mass.

Pulsed Doppler evaluation of both ovaries demonstrates normal
low-resistance arterial and venous waveforms.

Other findings

No abnormal free fluid.
IMPRESSION: 1. No evidence of adnexal torsion.
2. Large uterine fibroid measuring up to 7.7 cm, which may be
partially submucosal.
